# Patient Record
Sex: Male | Born: 1935 | Hispanic: Yes | Marital: Married | Smoking: Never smoker
Health system: Southern US, Community
[De-identification: ages and names within clinical notes are randomized; demographics above are authoritative.]

## PROBLEM LIST (undated history)

## (undated) DIAGNOSIS — E119 Type 2 diabetes mellitus without complications: Secondary | ICD-10-CM

## (undated) DIAGNOSIS — I10 Essential (primary) hypertension: Secondary | ICD-10-CM

## (undated) DIAGNOSIS — E785 Hyperlipidemia, unspecified: Secondary | ICD-10-CM

---

## 2015-07-09 ENCOUNTER — Emergency Department (HOSPITAL_COMMUNITY): Payer: Medicare (Managed Care)

## 2015-07-09 ENCOUNTER — Encounter (HOSPITAL_COMMUNITY): Payer: Self-pay | Admitting: *Deleted

## 2015-07-09 ENCOUNTER — Inpatient Hospital Stay (HOSPITAL_COMMUNITY)
Admission: EM | Admit: 2015-07-09 | Discharge: 2015-07-14 | DRG: 064 | Disposition: A | Discharge status: Skilled Nursing Facility | Payer: Medicare (Managed Care) | Attending: Internal Medicine | Admitting: Internal Medicine

## 2015-07-09 DIAGNOSIS — R4182 Altered mental status, unspecified: Secondary | ICD-10-CM | POA: Diagnosis not present

## 2015-07-09 DIAGNOSIS — Z833 Family history of diabetes mellitus: Secondary | ICD-10-CM

## 2015-07-09 DIAGNOSIS — I63522 Cerebral infarction due to unspecified occlusion or stenosis of left anterior cerebral artery: Secondary | ICD-10-CM

## 2015-07-09 DIAGNOSIS — E785 Hyperlipidemia, unspecified: Secondary | ICD-10-CM | POA: Diagnosis present

## 2015-07-09 DIAGNOSIS — I10 Essential (primary) hypertension: Secondary | ICD-10-CM | POA: Diagnosis present

## 2015-07-09 DIAGNOSIS — I69351 Hemiplegia and hemiparesis following cerebral infarction affecting right dominant side: Secondary | ICD-10-CM

## 2015-07-09 DIAGNOSIS — I639 Cerebral infarction, unspecified: Principal | ICD-10-CM

## 2015-07-09 DIAGNOSIS — J690 Pneumonitis due to inhalation of food and vomit: Secondary | ICD-10-CM

## 2015-07-09 DIAGNOSIS — B009 Herpesviral infection, unspecified: Secondary | ICD-10-CM | POA: Diagnosis present

## 2015-07-09 DIAGNOSIS — Z8249 Family history of ischemic heart disease and other diseases of the circulatory system: Secondary | ICD-10-CM

## 2015-07-09 DIAGNOSIS — R531 Weakness: Secondary | ICD-10-CM

## 2015-07-09 DIAGNOSIS — R339 Retention of urine, unspecified: Secondary | ICD-10-CM | POA: Diagnosis present

## 2015-07-09 DIAGNOSIS — F0391 Unspecified dementia with behavioral disturbance: Secondary | ICD-10-CM | POA: Insufficient documentation

## 2015-07-09 DIAGNOSIS — Z7189 Other specified counseling: Secondary | ICD-10-CM | POA: Insufficient documentation

## 2015-07-09 DIAGNOSIS — F03918 Unspecified dementia, unspecified severity, with other behavioral disturbance: Secondary | ICD-10-CM | POA: Insufficient documentation

## 2015-07-09 DIAGNOSIS — R1319 Other dysphagia: Secondary | ICD-10-CM | POA: Diagnosis present

## 2015-07-09 DIAGNOSIS — Z7984 Long term (current) use of oral hypoglycemic drugs: Secondary | ICD-10-CM

## 2015-07-09 DIAGNOSIS — Z515 Encounter for palliative care: Secondary | ICD-10-CM | POA: Insufficient documentation

## 2015-07-09 DIAGNOSIS — E119 Type 2 diabetes mellitus without complications: Secondary | ICD-10-CM | POA: Diagnosis present

## 2015-07-09 HISTORY — DX: Essential (primary) hypertension: I10

## 2015-07-09 HISTORY — DX: Type 2 diabetes mellitus without complications: E11.9

## 2015-07-09 HISTORY — DX: Hyperlipidemia, unspecified: E78.5

## 2015-07-09 LAB — URINALYSIS, ROUTINE W REFLEX MICROSCOPIC
Bilirubin Urine: NEGATIVE
Glucose, UA: NEGATIVE mg/dL
Ketones, ur: NEGATIVE mg/dL
Leukocytes, UA: NEGATIVE
Nitrite: NEGATIVE
PH: 5.5 (ref 5.0–8.0)
Protein, ur: NEGATIVE mg/dL
SPECIFIC GRAVITY, URINE: 1.023 (ref 1.005–1.030)
UROBILINOGEN UA: 1 mg/dL (ref 0.0–1.0)

## 2015-07-09 LAB — COMPREHENSIVE METABOLIC PANEL
ALBUMIN: 3.6 g/dL (ref 3.5–5.0)
ALK PHOS: 84 U/L (ref 38–126)
ALT: 23 U/L (ref 17–63)
ANION GAP: 13 (ref 5–15)
AST: 30 U/L (ref 15–41)
BILIRUBIN TOTAL: 1 mg/dL (ref 0.3–1.2)
BUN: 13 mg/dL (ref 6–20)
CALCIUM: 9.7 mg/dL (ref 8.9–10.3)
CO2: 23 mmol/L (ref 22–32)
Chloride: 96 mmol/L — ABNORMAL LOW (ref 101–111)
Creatinine, Ser: 0.94 mg/dL (ref 0.61–1.24)
GFR calc Af Amer: >60 mL/min (ref >60)
GFR calc non Af Amer: >60 mL/min (ref >60)
GLUCOSE: 144 mg/dL — AB (ref 65–99)
Potassium: 4.9 mmol/L (ref 3.5–5.1)
SODIUM: 132 mmol/L — AB (ref 135–145)
Total Protein: 7.4 g/dL (ref 6.5–8.1)

## 2015-07-09 LAB — PROTIME-INR
INR: 1.07 (ref 0.00–1.49)
PROTHROMBIN TIME: 14.1 s (ref 11.6–15.2)

## 2015-07-09 LAB — CBC WITH DIFFERENTIAL/PLATELET
BASOS ABS: 0 10*3/uL (ref 0.0–0.1)
BASOS PCT: 0 %
EOS ABS: 0.1 10*3/uL (ref 0.0–0.7)
Eosinophils Relative: 1 %
HEMATOCRIT: 45 % (ref 39.0–52.0)
HEMOGLOBIN: 15 g/dL (ref 13.0–17.0)
Lymphocytes Relative: 13 %
Lymphs Abs: 2 10*3/uL (ref 0.7–4.0)
MCH: 29.4 pg (ref 26.0–34.0)
MCHC: 33.3 g/dL (ref 30.0–36.0)
MCV: 88.2 fL (ref 78.0–100.0)
Monocytes Absolute: 0.9 10*3/uL (ref 0.1–1.0)
Monocytes Relative: 6 %
NEUTROS ABS: 12.7 10*3/uL — AB (ref 1.7–7.7)
NEUTROS PCT: 80 %
Platelets: 235 10*3/uL (ref 150–400)
RBC: 5.1 MIL/uL (ref 4.22–5.81)
RDW: 13.5 % (ref 11.5–15.5)
WBC: 15.6 10*3/uL — AB (ref 4.0–10.5)

## 2015-07-09 LAB — TROPONIN I: Troponin I: <0.03 ng/mL (ref <0.031)

## 2015-07-09 LAB — URINE MICROSCOPIC-ADD ON

## 2015-07-09 MED ORDER — LORAZEPAM 2 MG/ML IJ SOLN
1.0000 mg | Freq: Once | INTRAMUSCULAR | Status: AC
  Administered 2015-07-09: 1 mg via INTRAVENOUS
  Filled 2015-07-09: qty 1

## 2015-07-09 MED ORDER — SODIUM CHLORIDE 0.9 % IV BOLUS (SEPSIS)
1000.0000 mL | Freq: Once | INTRAVENOUS | Status: AC
  Administered 2015-07-09: 1000 mL via INTRAVENOUS

## 2015-07-09 NOTE — ED Notes (Signed)
Pt placed in a gown and hooked up to the monitor with a 5 lead, BP cuff and pulse ox 

## 2015-07-09 NOTE — ED Notes (Signed)
Pt brought by daughter stating that they just got off a plane from [place] and he is altered x 2 weeks.  Pt states she has limited knowledge of his condition stating he has recently in the last 2 weeks become more aggressive and unable to walk.  Pt has right sided weakness, non verbal, drooling from the left side of his mouth.  MD made aware and at bedside.  Pt is in advanced Alzheimer.

## 2015-07-09 NOTE — Consult Note (Signed)
Referring Physician: Dr Venora Maples, ED    Chief Complaint: altered mental status, right hemiparesis, stroke on MRI  HPI:                                                                                                                                         Willie Anderson is an 79 y.o. male with a past medical history significant for HTN, HLD, DM, and advanced dementia, brought in by family for evaluation of increased agitation and inability to move the right side. Patient has been living in [place] and reportedly has been increasingly agitated for the past few weeks, and at least 2 weeks ago became weak in the right side and stopped walking. Pt brought by daughter stating that they just got off a plane from [place]. MRI brain was personally reviewed and showed an acute infarct left frontal lobe. Presently, patient is awake, confused, answers yes/no questions.   Date last known well: unable to determine  Time last known well: unable to determine tPA Given: no, late presentation   Past Medical History  Diagnosis Date  . Hypertension   . Diabetes mellitus without complication ([place])   . Hyperlipemia     History reviewed. No pertinent past surgical history.  History reviewed. No pertinent family history. Social History:  reports that he has never smoked. He does not have any smokeless tobacco history on file. He reports that he drinks about 3.6 oz of alcohol per week. He reports that he does not use illicit drugs. Family history: no MS, epilepsy, brain tumor, or brain aneurysm Allergies: No Known Allergies  Medications:                                                                                                                           I have reviewed the patient's current medications.  ROS: unable to obtain due to mental status  History obtained  from family and chart review   Physical exam:  Constitutional: well developed, pleasant male in no apparent distress. Blood pressure 151/95, pulse 94, temperature 100.1 F (37.8 C), temperature source Rectal, resp. rate 22, height _0  (1.778 m), weight 81.647 kg (180 lb), SpO2 93 %. Eyes: no jaundice or exophthalmos.  Head: normocephalic. Neck: supple, no bruits, no JVD. Cardiac: no murmurs. Lungs: clear. Abdomen: soft, no tender, no mass. Extremities: no edema, clubbing, or cyanosis.  Skin: no rash  Neurologic Examination:                                                                                                      General: NAD Mental Status: Alert and awake, answers yes/no questions but does not follow comands. Cranial Nerves: II: Discs flat bilaterally; Visual fields grossly normal, pupils equal, round, reactive to light and accommodation III,IV, VI: ptosis not present, extra-ocular motions intact bilaterally V,VII: smile symmetric, facial light touch sensation ca not be reliably tested due to underlying dementia VIII: hearing normal bilaterally IX,X: uvula rises symmetrically XI: bilateral shoulder shrug no tested XII: midline tongue extension without atrophy or fasciculations Motor: Dense right hemiparesis. Tone decreased on the right Sensory: reacts to pain Deep Tendon Reflexes:  1+ all over Plantars: Right: mute   Left: mute Cerebellar: Unable to test due to mental status and weakness right side. Gait:  Unable to test due to mental status, multiple leads    Results for orders placed or performed during the hospital encounter of 07/09/15 (from the past 48 hour(s))  CBC with Differential/Platelet     Status: Abnormal   Collection Time: 07/09/15  9:32 PM  Result Value Ref Range   WBC 15.6 (H) 4.0 - 10.5 K/uL   RBC 5.10 4.22 - 5.81 MIL/uL   Hemoglobin 15.0 13.0 - 17.0 g/dL   HCT 45.0 39.0 - 52.0 %   MCV 88.2 78.0 - 100.0 fL   MCH 29.4 26.0 - 34.0 pg    MCHC 33.3 30.0 - 36.0 g/dL   RDW 13.5 11.5 - 15.5 %   Platelets 235 150 - 400 K/uL   Neutrophils Relative % 80 %   Neutro Abs 12.7 (H) 1.7 - 7.7 K/uL   Lymphocytes Relative 13 %   Lymphs Abs 2.0 0.7 - 4.0 K/uL   Monocytes Relative 6 %   Monocytes Absolute 0.9 0.1 - 1.0 K/uL   Eosinophils Relative 1 %   Eosinophils Absolute 0.1 0.0 - 0.7 K/uL   Basophils Relative 0 %   Basophils Absolute 0.0 0.0 - 0.1 K/uL  Comprehensive metabolic panel     Status: Abnormal   Collection Time: 07/09/15  9:32 PM  Result Value Ref Range   Sodium 132 (L) 135 - 145 mmol/L   Potassium 4.9 3.5 - 5.1 mmol/L   Chloride 96 (L) 101 - 111 mmol/L   CO2 23 22 - 32 mmol/L   Glucose, Bld 144 (H) 65 - 99 mg/dL   BUN 13 6 - 20 mg/dL   Creatinine, Ser 0.94 0.61 - 1.24 mg/dL  Calcium 9.7 8.9 - 10.3 mg/dL   Total Protein 7.4 6.5 - 8.1 g/dL   Albumin 3.6 3.5 - 5.0 g/dL   AST 30 15 - 41 U/L   ALT 23 17 - 63 U/L   Alkaline Phosphatase 84 38 - 126 U/L   Total Bilirubin 1.0 0.3 - 1.2 mg/dL   GFR calc non Af Amer >60 >60 mL/min   GFR calc Af Amer >60 >60 mL/min    Comment: (NOTE) The eGFR has been calculated using the CKD EPI equation. This calculation has not been validated in all clinical situations. eGFR's persistently <60 mL/min signify possible Chronic Kidney Disease.    Anion gap 13 5 - 15  Troponin I     Status: None   Collection Time: 07/09/15  9:32 PM  Result Value Ref Range   Troponin I <0.03 <0.031 ng/mL    Comment:        NO INDICATION OF MYOCARDIAL INJURY.   Protime-INR     Status: None   Collection Time: 07/09/15  9:32 PM  Result Value Ref Range   Prothrombin Time 14.1 11.6 - 15.2 seconds   INR 1.07 0.00 - 1.49  Urinalysis, Routine w reflex microscopic (not at [facility])     Status: Abnormal   Collection Time: 07/09/15 10:10 PM  Result Value Ref Range   Color, Urine AMBER (A) YELLOW    Comment: BIOCHEMICALS MAY BE AFFECTED BY COLOR   APPearance CLOUDY (A) CLEAR   Specific Gravity, Urine 1.023  1.005 - 1.030   pH 5.5 5.0 - 8.0   Glucose, UA NEGATIVE NEGATIVE mg/dL   Hgb urine dipstick LARGE (A) NEGATIVE   Bilirubin Urine NEGATIVE NEGATIVE   Ketones, ur NEGATIVE NEGATIVE mg/dL   Protein, ur NEGATIVE NEGATIVE mg/dL   Urobilinogen, UA 1.0 0.0 - 1.0 mg/dL   Nitrite NEGATIVE NEGATIVE   Leukocytes, UA NEGATIVE NEGATIVE  Urine microscopic-add on     Status: Abnormal   Collection Time: 07/09/15 10:10 PM  Result Value Ref Range   WBC, UA 0-2 <3 WBC/hpf   RBC / HPF 21-50 <3 RBC/hpf   Bacteria, UA FEW (A) RARE   Urine-Other MUCOUS PRESENT    Ct Head Wo Contrast  07/09/2015  CLINICAL DATA:  Right-sided weakness, altered mental status. Nonverbal. Alzheimer's. EXAM: CT HEAD WITHOUT CONTRAST TECHNIQUE: Contiguous axial images were obtained from the base of the skull through the vertex without intravenous contrast. COMPARISON:  None. FINDINGS: There is generalized brain atrophy with commensurate dilatation of the ventricles and sulci. Confluent areas of chronic small vessel ischemic change noted within the bilateral periventricular and subcortical white matter regions. Small old lacunar infarct within the right basal ganglia. There are chronic calcified atherosclerotic changes of the large vessels at the skull base. There is no mass, hemorrhage, edema, or other evidence of acute parenchymal abnormality. No extra-axial hemorrhage. No osseous abnormality. Visualized upper paranasal sinuses are clear. Mastoid air cells are clear. IMPRESSION: 1. No evidence of acute intracranial abnormality. No intracranial mass, hemorrhage, or edema. 2. Atrophy and chronic ischemic changes as detailed above. These results were called by telephone at the time of interpretation on 07/09/2015 at 9:27 pm to Dr. Jola Schmidt , who verbally acknowledged these results. Electronically Signed   By: Franki Cabot M.D.   On: 07/09/2015 21:27   Dg Chest Portable 1 View  07/09/2015  CLINICAL DATA:  Altered mental status for 2  weeks. Right-sided weakness. Initial encounter. EXAM: PORTABLE CHEST 1 VIEW COMPARISON:  None. FINDINGS:  The lungs are hypoexpanded. A right-sided calcified granuloma is noted. Minimal left basilar atelectasis is noted. There is no evidence of pleural effusion or pneumothorax. The cardiomediastinal silhouette is borderline normal in size. No acute osseous abnormalities are seen. IMPRESSION: Lungs hypoexpanded.  Minimal left basilar atelectasis noted. Electronically Signed   By: Garald Balding M.D.   On: 07/09/2015 21:21   Mr Brain Ltd W/o Cm  07/09/2015  CLINICAL DATA:  RIGHT-sided weakness after plane ride from [place]. Altered mental status for 2 weeks, aggressive, unable to walk. History of advanced at Alzheimer's disease. EXAM: MRI HEAD WITHOUT CONTRAST TECHNIQUE: Axial diffusion weighted imaging, sagittal T1 and axial T2 propeller. Patient was unable to tolerate further imaging due to agitation. COMPARISON:  CT head July 09, 2015 at 2114 hours. FINDINGS: Severely motion degraded axial T2 sequence, moderately motion degraded sagittal T1. 12 x 15 mm area of reduced diffusion LEFT posterior frontal lobe, with normalizing ADC values, and T2 bright signal. Moderate ventriculomegaly on the basis of global parenchymal brain volume loss better seen on prior CT. Patchy to confluent supratentorial white matter T2 hyperintensities. No midline shift. No abnormal extra-axial fluid collections. Major intracranial vascular flow voids present at the skullbase. No abnormal sellar expansion. No cerebellar tonsillar ectopia. No suspicious calvarial bone marrow signal. IMPRESSION: Limited motion degraded MRI, patient unable to complete the examination due to agitation. 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 At least moderate global parenchymal brain volume loss and moderate chronic small vessel ischemic disease. Electronically Signed   By: Elon Alas M.D.   On: 07/09/2015 23:38      Assessment: 79 y.o. male with HTN, HLD, DM, and advanced dementia, brought in by family for evaluation of increased agitation and right hemiparesis. Early subacute infarct on DWI-MRI. Patient is out of the window for iv tpa or endovascular intervention. Further, has advanced dementia and I am not sure he will benefit from pursuing comprehensive stroke work up. Stroke team will follow up tomorrow and make further recommendations. Admit to medicine. PT.    Stroke Risk Factors - age, HTN, HLD, DM  Plan: 1. HgbA1c, fasting lipid panel 2. MRI, MRA  of the brain without contrast 3. Echocardiogram 4. Carotid dopplers 5. Prophylactic therapy-ASA 6. Risk factor modification 7. Telemetry monitoring 8. Frequent neuro checks 9. PT/OT SLP 10. NPO    Dorian Pod, MD [facility] [phone]  07/09/2015, 11:54 PM

## 2015-07-09 NOTE — ED Notes (Signed)
Pt still off unit with MRI

## 2015-07-09 NOTE — ED Provider Notes (Signed)
CSN: [id number]     Arrival date & time 07/09/15  2032 History   First MD Initiated Contact with Patient 07/09/15 2034     Chief Complaint  Patient presents with  . Altered Mental Status     Level V caveat: Altered mental status  HPI History obtained from the patient's daughter  Patient lives in [place] and was brought to the [place] this evening by his daughter on an airplane as the daughter reports that he was having increasing confusion and agitation over the past several weeks while in [place] and over the past several days has had more limited mobility.  She states that her father has a history of dementia as well as diabetes, hypertension, hyperlipidemia.  She states that the physicians in [place] could not explain to her why he was having worsening symptoms over the past several weeks and that she flew to [place] and brought him back to the [place].  She reports that normally he can talk but the patient has not been communicating.  Daughters also reported drooling coming out of his mouth.  She reports that normally he needs some assistance to put his clothes on but he can ambulate.  She came immediately from the airport to the emergency department.  No reports of vomiting or diarrhea.  No reports of recent fever as far she knows.  Past Medical History  Diagnosis Date  . Hypertension   . Diabetes mellitus without complication ([facility])   . Hyperlipemia    History reviewed. No pertinent past surgical history. History reviewed. No pertinent family history. Social History  Substance Use Topics  . Smoking status: Never Smoker   . Smokeless tobacco: None  . Alcohol Use: 3.6 oz/week    6 Cans of beer per week     Comment: non past year    Review of Systems  Unable to perform ROS: Mental status change      Allergies  Review of patient's allergies indicates no known allergies.  Home Medications   Prior to Admission medications   Not on File   BP  137/89 mmHg  Pulse 99  Temp(Src) 100.1 F (37.8 C) (Rectal)  Resp 25  Ht 5\' 10"  (1.778 m)  Wt 180 lb (81.647 kg)  BMI 25.83 kg/m2  SpO2 95% Physical Exam  Constitutional: He appears well-developed and well-nourished.  HENT:  Head: Normocephalic and atraumatic.  Drooling of the left side of his mouth.  Eyes: EOM are normal.  Neck: Normal range of motion.  Cardiovascular: Normal rate, regular rhythm, normal heart sounds and intact distal pulses.   Pulmonary/Chest: Effort normal and breath sounds normal. No respiratory distress.  Abdominal: Soft. He exhibits no distension. There is no tenderness.  Musculoskeletal: Normal range of motion.  Neurological: He is alert.  Unable to follow simple commands.  Hemiparesis of his right upper and right lower extremity.  Able to hold his left hand up in his left leg up against gravity.  Left-sided facial droop.  Receptive aphasia  Skin: Skin is warm and dry.  Psychiatric: He has a normal mood and affect. Judgment normal.  Nursing note and vitals reviewed.   ED Course  Procedures (including critical care time) Labs Review Labs Reviewed  CBC WITH DIFFERENTIAL/PLATELET - Abnormal; Notable for the following:    WBC 15.6 (*)    Neutro Abs 12.7 (*)    All other components within normal limits  COMPREHENSIVE METABOLIC PANEL - Abnormal; Notable for the following:  Sodium 132 (*)    Chloride 96 (*)    Glucose, Bld 144 (*)    All other components within normal limits  URINALYSIS, ROUTINE W REFLEX MICROSCOPIC (NOT AT [facility]) - Abnormal; Notable for the following:    Color, Urine AMBER (*)    APPearance CLOUDY (*)    Hgb urine dipstick LARGE (*)    All other components within normal limits  URINE MICROSCOPIC-ADD ON - Abnormal; Notable for the following:    Bacteria, UA FEW (*)    All other components within normal limits  URINE CULTURE  CULTURE, BLOOD (ROUTINE X 2)  CULTURE, BLOOD (ROUTINE X 2)  TROPONIN I  PROTIME-INR  LACTIC ACID, PLASMA   ETHANOL    Imaging Review Ct Head Wo Contrast  07/09/2015  CLINICAL DATA:  Right-sided weakness, altered mental status. Nonverbal. Alzheimer's. EXAM: CT HEAD WITHOUT CONTRAST TECHNIQUE: Contiguous axial images were obtained from the base of the skull through the vertex without intravenous contrast. COMPARISON:  None. FINDINGS: There is generalized brain atrophy with commensurate dilatation of the ventricles and sulci. Confluent areas of chronic small vessel ischemic change noted within the bilateral periventricular and subcortical white matter regions. Small old lacunar infarct within the right basal ganglia. There are chronic calcified atherosclerotic changes of the large vessels at the skull base. There is no mass, hemorrhage, edema, or other evidence of acute parenchymal abnormality. No extra-axial hemorrhage. No osseous abnormality. Visualized upper paranasal sinuses are clear. Mastoid air cells are clear. IMPRESSION: 1. No evidence of acute intracranial abnormality. No intracranial mass, hemorrhage, or edema. 2. Atrophy and chronic ischemic changes as detailed above. These results were called by telephone at the time of interpretation on 07/09/2015 at 9:27 pm to Dr. Azalia Bilis , who verbally acknowledged these results. Electronically Signed   By: Bary Richard M.D.   On: 07/09/2015 21:27   Dg Chest Portable 1 View  07/09/2015  CLINICAL DATA:  Altered mental status for 2 weeks. Right-sided weakness. Initial encounter. EXAM: PORTABLE CHEST 1 VIEW COMPARISON:  None. FINDINGS: The lungs are hypoexpanded. A right-sided calcified granuloma is noted. Minimal left basilar atelectasis is noted. There is no evidence of pleural effusion or pneumothorax. The cardiomediastinal silhouette is borderline normal in size. No acute osseous abnormalities are seen. IMPRESSION: Lungs hypoexpanded.  Minimal left basilar atelectasis noted. Electronically Signed   By: Roanna Raider M.D.   On: 07/09/2015 21:21   I have  personally reviewed and evaluated these images and lab results as part of my medical decision-making.  ECG interpretation   Date: 07/09/2015  Rate: 95  Rhythm: normal sinus rhythm  QRS Axis: normal  Intervals: normal  ST/T Wave abnormalities: normal  Conduction Disutrbances: none  Narrative Interpretation:   Old EKG Reviewed: no prior ecg available     MDM   Final diagnoses:  Cerebrovascular accident (CVA), unspecified mechanism ([facility])    MRI with subacute left brain stroke which is consistent with his right-sided weakness.  Neurology consultation.  Admission to hospitalist.  Rectal temp of 100.1.  Remainder of labs without significant abnormality.    Azalia Bilis, MD 07/10/15 [phone]

## 2015-07-09 NOTE — ED Notes (Signed)
Pt returned to the room from MRI.

## 2015-07-10 ENCOUNTER — Inpatient Hospital Stay (HOSPITAL_COMMUNITY): Payer: Medicare (Managed Care)

## 2015-07-10 DIAGNOSIS — F0391 Unspecified dementia with behavioral disturbance: Secondary | ICD-10-CM | POA: Insufficient documentation

## 2015-07-10 DIAGNOSIS — I10 Essential (primary) hypertension: Secondary | ICD-10-CM | POA: Diagnosis present

## 2015-07-10 DIAGNOSIS — I639 Cerebral infarction, unspecified: Secondary | ICD-10-CM

## 2015-07-10 DIAGNOSIS — J690 Pneumonitis due to inhalation of food and vomit: Secondary | ICD-10-CM | POA: Diagnosis present

## 2015-07-10 DIAGNOSIS — F03918 Unspecified dementia, unspecified severity, with other behavioral disturbance: Secondary | ICD-10-CM | POA: Insufficient documentation

## 2015-07-10 DIAGNOSIS — Z833 Family history of diabetes mellitus: Secondary | ICD-10-CM | POA: Diagnosis not present

## 2015-07-10 DIAGNOSIS — I6789 Other cerebrovascular disease: Secondary | ICD-10-CM | POA: Diagnosis not present

## 2015-07-10 DIAGNOSIS — E785 Hyperlipidemia, unspecified: Secondary | ICD-10-CM | POA: Diagnosis present

## 2015-07-10 DIAGNOSIS — E119 Type 2 diabetes mellitus without complications: Secondary | ICD-10-CM | POA: Diagnosis present

## 2015-07-10 DIAGNOSIS — M6289 Other specified disorders of muscle: Secondary | ICD-10-CM

## 2015-07-10 DIAGNOSIS — I69351 Hemiplegia and hemiparesis following cerebral infarction affecting right dominant side: Secondary | ICD-10-CM | POA: Diagnosis not present

## 2015-07-10 DIAGNOSIS — Z8249 Family history of ischemic heart disease and other diseases of the circulatory system: Secondary | ICD-10-CM | POA: Diagnosis not present

## 2015-07-10 DIAGNOSIS — Z7984 Long term (current) use of oral hypoglycemic drugs: Secondary | ICD-10-CM | POA: Diagnosis not present

## 2015-07-10 DIAGNOSIS — R531 Weakness: Secondary | ICD-10-CM | POA: Diagnosis present

## 2015-07-10 DIAGNOSIS — Z7189 Other specified counseling: Secondary | ICD-10-CM | POA: Diagnosis not present

## 2015-07-10 DIAGNOSIS — Z515 Encounter for palliative care: Secondary | ICD-10-CM | POA: Diagnosis not present

## 2015-07-10 DIAGNOSIS — R1319 Other dysphagia: Secondary | ICD-10-CM | POA: Diagnosis present

## 2015-07-10 DIAGNOSIS — R339 Retention of urine, unspecified: Secondary | ICD-10-CM | POA: Diagnosis present

## 2015-07-10 DIAGNOSIS — I6300 Cerebral infarction due to thrombosis of unspecified precerebral artery: Secondary | ICD-10-CM | POA: Diagnosis not present

## 2015-07-10 DIAGNOSIS — B009 Herpesviral infection, unspecified: Secondary | ICD-10-CM | POA: Diagnosis present

## 2015-07-10 DIAGNOSIS — R4182 Altered mental status, unspecified: Secondary | ICD-10-CM | POA: Diagnosis present

## 2015-07-10 LAB — LIPID PANEL
CHOLESTEROL: 155 mg/dL (ref 0–200)
HDL: 27 mg/dL — ABNORMAL LOW (ref >40)
LDL Cholesterol: 94 mg/dL (ref 0–99)
TRIGLYCERIDES: 168 mg/dL — AB (ref <150)
Total CHOL/HDL Ratio: 5.7 RATIO
VLDL: 34 mg/dL (ref 0–40)

## 2015-07-10 LAB — COMPREHENSIVE METABOLIC PANEL
ALK PHOS: 82 U/L (ref 38–126)
ALT: 19 U/L (ref 17–63)
ANION GAP: 10 (ref 5–15)
AST: 25 U/L (ref 15–41)
Albumin: 3.2 g/dL — ABNORMAL LOW (ref 3.5–5.0)
BUN: 13 mg/dL (ref 6–20)
CALCIUM: 9.3 mg/dL (ref 8.9–10.3)
CO2: 24 mmol/L (ref 22–32)
CREATININE: 0.98 mg/dL (ref 0.61–1.24)
Chloride: 100 mmol/L — ABNORMAL LOW (ref 101–111)
Glucose, Bld: 137 mg/dL — ABNORMAL HIGH (ref 65–99)
Potassium: 4.2 mmol/L (ref 3.5–5.1)
Sodium: 134 mmol/L — ABNORMAL LOW (ref 135–145)
TOTAL PROTEIN: 6.6 g/dL (ref 6.5–8.1)
Total Bilirubin: 0.8 mg/dL (ref 0.3–1.2)

## 2015-07-10 LAB — GLUCOSE, CAPILLARY
GLUCOSE-CAPILLARY: 142 mg/dL — AB (ref 65–99)
GLUCOSE-CAPILLARY: 138 mg/dL — AB (ref 65–99)
GLUCOSE-CAPILLARY: 128 mg/dL — AB (ref 65–99)
GLUCOSE-CAPILLARY: 129 mg/dL — AB (ref 65–99)
Glucose-Capillary: 117 mg/dL — ABNORMAL HIGH (ref 65–99)

## 2015-07-10 LAB — MAGNESIUM: Magnesium: 2 mg/dL (ref 1.7–2.4)

## 2015-07-10 LAB — ETHANOL

## 2015-07-10 MED ORDER — ASPIRIN EC 81 MG PO TBEC: 81.0000 mg | DELAYED_RELEASE_TABLET | Freq: Every day | ORAL | Status: DC

## 2015-07-10 MED ORDER — STROKE: EARLY STAGES OF RECOVERY BOOK
Freq: Once | Status: AC
Start: 2015-07-10 — End: 2015-07-10
  Administered 2015-07-10: 01:00:00
  Filled 2015-07-10: qty 1

## 2015-07-10 MED ORDER — LORAZEPAM 2 MG/ML IJ SOLN: 0.5000 mg | Freq: Two times a day (BID) | INTRAMUSCULAR | Status: DC | PRN

## 2015-07-10 MED ORDER — ASPIRIN 300 MG RE SUPP
300.0000 mg | Freq: Every day | RECTAL | Status: DC
  Administered 2015-07-10: 300 mg via RECTAL
  Filled 2015-07-10 (×2): qty 1

## 2015-07-10 MED ORDER — SENNOSIDES-DOCUSATE SODIUM 8.6-50 MG PO TABS: 1.0000 | ORAL_TABLET | Freq: Every evening | ORAL | Status: DC | PRN

## 2015-07-10 MED ORDER — SODIUM CHLORIDE 0.9 % IV SOLN
INTRAVENOUS | Status: DC
  Administered 2015-07-10 – 2015-07-12 (×3): via INTRAVENOUS

## 2015-07-10 MED ORDER — INSULIN ASPART 100 UNIT/ML ~~LOC~~ SOLN
0.0000 [IU] | SUBCUTANEOUS | Status: DC
  Administered 2015-07-10: 1 [IU] via SUBCUTANEOUS
  Administered 2015-07-11: 2 [IU] via SUBCUTANEOUS

## 2015-07-10 MED ORDER — HEPARIN SODIUM (PORCINE) 5000 UNIT/ML IJ SOLN
5000.0000 [IU] | Freq: Three times a day (TID) | INTRAMUSCULAR | Status: DC
  Administered 2015-07-10 – 2015-07-14 (×11): 5000 [IU] via SUBCUTANEOUS
  Filled 2015-07-10 (×13): qty 1

## 2015-07-10 NOTE — Progress Notes (Signed)
Palliative Medicine Team consult was received. I met briefly with the patient's daughter this evening.  She is in agreement with meeting tomorrow to discuss goals of care moving forward.  If there are urgent needs or questions please call [phone].   Thank you for consulting out team to assist with this patients care.  Micheline Rough, MD [facility] [phone]

## 2015-07-10 NOTE — ED Notes (Signed)
Attempted report 

## 2015-07-10 NOTE — H&P (Signed)
[facility] History and Physical  Tuvia Woodrick BJY:[id number] DOB: Apr 27, 1936 DOA: 07/09/2015   PCP: No primary care provider on file.    Chief Complaint: Stroke  HPI: [place] Hellums is a 79 y.o. HM PMHx  HTN, HLD, Diabetes type 2  Patient lives in [place] and was brought to the [place] this evening by his daughter on an airplane as the daughter reports that he was having increasing confusion and agitation over the past several weeks while in [place] and over the past several days has had more limited mobility. She states that her father has a history of dementia as well as diabetes, hypertension, hyperlipidemia. She states that the physicians in [place] could not explain to her why he was having worsening symptoms over the past several weeks and that she flew to [place] and brought him back to the [place]. She reports that normally he can talk but the patient has not been communicating. Daughters also reported drooling coming out of his mouth. She reports that normally he needs some assistance to put his clothes on but he can ambulate. She came immediately from the airport to the emergency department. No reports of vomiting or diarrhea. No reports of recent fever as far she knows.  Patient A/O 0, obtunded, withdraws LUE/LLE/RLE to painful stimuli  Review of Systems: Unable to obtain     TRAVEL HISTORY: Just arrived from [place]   Consultants:  Arn Medal (neuro hospitalist)  Procedure/Significant Events:  10/29 CT head without contrast; Atrophy and chronic ischemic changes.-Negative CVA  10/29 MRI brain without contrast;Early subacute 12 x 15 mm LEFT posterior frontal lobe infarct -moderate global parenchymal brain volume loss/moderatechronic small vessel ischemic disease   Culture  NA   Antibiotics:   NA  DVT prophylaxis:  Heparin subcutaneous  Devices     LINES / TUBES:     Past Medical History   Diagnosis Date  . Hypertension   . Diabetes mellitus without complication (HCC)   . Hyperlipemia    History reviewed. No pertinent past surgical history. Social History:  reports that he has never smoked. He does not have any smokeless tobacco history on file. He reports that he drinks about 3.6 oz of alcohol per week. He reports that he does not use illicit drugs. where does patient live--home, ALF, SNF? Unable to obtain secondary to patient being obtunded Can patient participate in ADLs? No  No Known Allergies  FMHx .Unable to obtain secondary to patient being attended   .    Prior to Admission medications   Medication Sig Start Date End Date Taking? Authorizing Provider  clonazePAM (KLONOPIN) 2 MG tablet Take 2 mg by mouth 2 (two) times daily.   Yes Historical Provider, MD  furosemide (LASIX) 20 MG tablet Take 20 mg by mouth daily.   Yes Historical Provider, MD  lisinopril (PRINIVIL,ZESTRIL) 10 MG tablet Take 10 mg by mouth daily.   Yes Historical Provider, MD  metFORMIN (GLUCOPHAGE) 500 MG tablet Take 500 mg by mouth daily with breakfast.   Yes Historical Provider, MD  simvastatin (ZOCOR) 10 MG tablet Take 10 mg by mouth daily.   Yes Historical Provider, MD  temazepam (RESTORIL) 15 MG capsule Take 15 mg by mouth at bedtime.   Yes Historical Provider, MD   Physical Exam: Filed Vitals:   07/09/15 2215 07/10/15 0000 07/10/15 0044 07/10/15 0133  BP: 151/95 140/75 145/76 127/75  Pulse: 94   93  Temp:    98.8  F (37.1 C)  TempSrc:    Axillary  Resp: 22   18  Height:      Weight:      SpO2: 93%   95%     General: A/O 0, obtunded, No acute respiratory distress Eyes: Pinpoint pupils, unreactive to light, negative scleral hemorrhage ENT: Negative Runny negative gingival bleeding Neck:  Negative scars, masses, torticollis, lymphadenopathy, JVD Lungs: Clear to auscultation bilaterally without wheezes or crackles Cardiovascular: Regular rate and rhythm without murmur gallop or  rub normal S1 and S2 Abdomen:negative abdominal pain, negative dysphagia, Nontender, nondistended, soft, bowel sounds positive, no rebound, no ascites, no appreciable mass Extremities: No significant cyanosis, clubbing, or edema bilateral lower extremities Psychiatric:  Unable to obtain secondary to patient being obtunded Neurologic: Unable to obtain secondary to patient being obtunded Skin/Hemolytic/lymphatic; negative purpura, petechia,     Labs on Admission:  Basic Metabolic Panel:  Recent Labs Lab 07/09/15 2132  NA 132*  K 4.9  CL 96*  CO2 23  GLUCOSE 144*  BUN 13  CREATININE 0.94  CALCIUM 9.7   Liver Function Tests:  Recent Labs Lab 07/09/15 2132  AST 30  ALT 23  ALKPHOS 84  BILITOT 1.0  PROT 7.4  ALBUMIN 3.6   No results for input(s): LIPASE, AMYLASE in the last 168 hours. No results for input(s): AMMONIA in the last 168 hours. CBC:  Recent Labs Lab 07/09/15 2132  WBC 15.6*  NEUTROABS 12.7*  HGB 15.0  HCT 45.0  MCV 88.2  PLT 235   Cardiac Enzymes:  Recent Labs Lab 07/09/15 2132  TROPONINI <0.03    BNP (last 3 results) No results for input(s): BNP in the last 8760 hours.  ProBNP (last 3 results) No results for input(s): PROBNP in the last 8760 hours.  CBG: No results for input(s): GLUCAP in the last 168 hours.  Radiological Exams on Admission: Ct Head Wo Contrast  07/09/2015  CLINICAL DATA:  Right-sided weakness, altered mental status. Nonverbal. Alzheimer's. EXAM: CT HEAD WITHOUT CONTRAST TECHNIQUE: Contiguous axial images were obtained from the base of the skull through the vertex without intravenous contrast. COMPARISON:  None. FINDINGS: There is generalized brain atrophy with commensurate dilatation of the ventricles and sulci. Confluent areas of chronic small vessel ischemic change noted within the bilateral periventricular and subcortical white matter regions. Small old lacunar infarct within the right basal ganglia. There are chronic  calcified atherosclerotic changes of the large vessels at the skull base. There is no mass, hemorrhage, edema, or other evidence of acute parenchymal abnormality. No extra-axial hemorrhage. No osseous abnormality. Visualized upper paranasal sinuses are clear. Mastoid air cells are clear. IMPRESSION: 1. No evidence of acute intracranial abnormality. No intracranial mass, hemorrhage, or edema. 2. Atrophy and chronic ischemic changes as detailed above. These results were called by telephone at the time of interpretation on 07/09/2015 at 9:27 pm to Dr. Azalia Bilis , who verbally acknowledged these results. Electronically Signed   By: Bary Richard M.D.   On: 07/09/2015 21:27   Dg Chest Portable 1 View  07/09/2015  CLINICAL DATA:  Altered mental status for 2 weeks. Right-sided weakness. Initial encounter. EXAM: PORTABLE CHEST 1 VIEW COMPARISON:  None. FINDINGS: The lungs are hypoexpanded. A right-sided calcified granuloma is noted. Minimal left basilar atelectasis is noted. There is no evidence of pleural effusion or pneumothorax. The cardiomediastinal silhouette is borderline normal in size. No acute osseous abnormalities are seen. IMPRESSION: Lungs hypoexpanded.  Minimal left basilar atelectasis noted. Electronically Signed   By:  Roanna RaiderJeffery  Chang M.D.   On: 07/09/2015 21:21   Mr Brain Ltd W/o Cm  07/09/2015  CLINICAL DATA:  RIGHT-sided weakness after plane ride from [place]. Altered mental status for 2 weeks, aggressive, unable to walk. History of advanced at Alzheimer's disease. EXAM: MRI HEAD WITHOUT CONTRAST TECHNIQUE: Axial diffusion weighted imaging, sagittal T1 and axial T2 propeller. Patient was unable to tolerate further imaging due to agitation. COMPARISON:  CT head July 09, 2015 at 2114 hours. FINDINGS: Severely motion degraded axial T2 sequence, moderately motion degraded sagittal T1. 12 x 15 mm area of reduced diffusion LEFT posterior frontal lobe, with normalizing ADC values, and T2 bright  signal. Moderate ventriculomegaly on the basis of global parenchymal brain volume loss better seen on prior CT. Patchy to confluent supratentorial white matter T2 hyperintensities. No midline shift. No abnormal extra-axial fluid collections. Major intracranial vascular flow voids present at the skullbase. No abnormal sellar expansion. No cerebellar tonsillar ectopia. No suspicious calvarial bone marrow signal. IMPRESSION: Limited motion degraded MRI, patient unable to complete the examination due to agitation. 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 At least moderate global parenchymal brain volume loss and moderate chronic small vessel ischemic disease. Electronically Signed   By: Awilda Metroourtnay  Bloomer M.D.   On: 07/09/2015 23:38    EKG:   Assessment/Plan Active Problems:   Stroke (cerebrum) (HCC)   Aspiration pneumonia (HCC)   Cerebrovascular accident (CVA) (HCC)   Right sided weakness   Essential hypertension   HLD (hyperlipidemia)  Stroke -Patient with subacute stroke. -Obtain echocardiogram -Obtain carotid Doppler -If patient's cognition does not improve in next 24-36 hours will repeat brain MRI R/O extension of stroke.  HTN -Allow permissive HTN SBP goal 145-165 -Normal saline 4975ml/hr  HLD,  -Lipid panel pending -When patient able to take by mouth will start Lipitor  Diabetes type 2 -Sensitive SSI  -Hemoglobin A1c pending   Aspiration pneumonia? -Will hold on starting antibiotics, however if WBCs do not trend down or patient spikes fever, or CXR positive for development of pneumonia will begin appropriate antibiotic    Code Status: Full Family Communication: None  Disposition Plan: CIR vs SNF   Care during the described time interval was provided by me .  I have reviewed this patient's available data, including medical history, events of note, physical examination, and all test results as part of my evaluation. I have personally reviewed and  interpreted all radiology studies.    Time spent: 60 minute  WOODS, Roselind MessierCURTIS J [facility] Pager [phone]  If 7PM-7AM, please contact night-coverage [email or web address] Password [email or web address] 07/10/2015, 3:07 AM

## 2015-07-10 NOTE — Progress Notes (Signed)
VASCULAR LAB PRELIMINARY  PRELIMINARY  PRELIMINARY  PRELIMINARY  Carotid duplex completed.    Preliminary report:  Bilateral:  1-39% ICA stenosis.  Vertebral artery flow is antegrade.     Man Bonneau, RVS 07/10/2015, 8:25 AM

## 2015-07-10 NOTE — Progress Notes (Signed)
PT Cancellation Note  Patient Details Name: Willie Anderson MRN: [id number] DOB: 03/02/1936   Cancelled Treatment:    Reason Eval/Treat Not Completed: Medical issues which prohibited therapy (bedrest).  Will check tomorrow to see if precautions lifted.   Ivar DrapeStout, Areanna Gengler E 07/10/2015, 11:35 AM   Samul Dadauth Alira Fretwell, PT MS Acute Rehab Dept. Number: [facility] [phone] and [facility] [phone]

## 2015-07-10 NOTE — Progress Notes (Addendum)
PATIENT DETAILS Name: Willie Anderson Age: 79 y.o. Sex: male Date of Birth: [birth year] Admit Date: 07/09/2015 Admitting Physician Drema Dallas, MD PCP:No primary care provider on file.  Brief narrative:   79 year old [place] male with history of advanced dementia, diabetes, hypertension, dyslipidemia-brought to [facility] [place] for evaluation of worsening mental status and right-sided hemiparesis. MRI brain positive for an acute left frontal lobe infarct. Stroke workup in progress, remains nothing by mouth, very poor overall prognoses-after discussion with family-palliative care consulted.   Subjective: Coughing-seems to be poolling some secretions at times. Even with a nurse tech was speaks Spanish-not following commands.   Assessment/Plan: Acute CVA: With resultant significant dysphagia, dysarthria and right-sided hemiparesis. MRI brain positive for acute left frontal lobe infarct. Carotid Doppler negative for significant stenosis. LDL 94 (goal<70). Echocardiogram and A1c pending.Since nothing by mouth and failed swallow evaluation-change aspirin to suppository, await further input from neurology-but given advanced dementia-and significant deficits due to acute CVA-suspect further aggressive workup will not improve quality of life. Spoke with patient's daughter Kasandra Knudsen over the phone-explained poor long-term prognoses-explained issues with dysphagia, mobility, disposition. She will discuss with family, however we will go ahead and obtain a palliative care consultation.  Dysphagia: secondary to above-suspect given advanced dementia could have had underlying mild dysphagia to begin with. Keep nothing by mouth, await SLP follow-up evaluation-family aware of significant risk of aspiration of secretions. Consulted palliative care  Leukocytosis: Afebrile, UA negative for UTI, chest x-ray negative pneumonia-is at risk for aspiration. Blood cultures  negative so far. Continue to monitor off antibiotics-repeat CBC in a.m.  Hypertension: Allow permissive hypertension-hold lisinopril.  Dyslipidemia: See above regarding LDL-unable to start statins is nothing by mouth.  Advanced dementia: Supportive care for now-unfortunately now with acute CVA and with poor long-term prognosis   Goals of care: Remains full code. Briefly spoke with patient's daughter Kasandra Knudsen over the phone-explained for long-term prognoses-explained issues regarding dysphagia/mobility and disposition. Await further goals of care discussion with palliative care team  Disposition: Remain inpatient  Antimicrobial agents  See below  Anti-infectives    None      DVT Prophylaxis: Prophylactic Heparin  Code Status: Full code   Family Communication D[place]ter-Monday at [place] over the phone   Procedures: None  CONSULTS:  neurology  Time spent 30 minutes-Greater than 50% of this time was spent in counseling, explanation of diagnosis, planning of further management, and coordination of care.  MEDICATIONS: Scheduled Meds: . aspirin EC  81 mg Oral Daily  . heparin  5,000 Units Subcutaneous 3 times per day  . insulin aspart  0-9 Units Subcutaneous 6 times per day   Continuous Infusions: . sodium chloride 75 mL/hr at 07/10/15 1000   PRN Meds:.senna-docusate    PHYSICAL EXAM: Vital signs in last 24 hours: Filed Vitals:   07/10/15 0327 07/10/15 0542 07/10/15 0727 07/10/15 0952  BP: 137/85 118/78 133/81 103/91  Pulse:  94 72 73  Temp: 98.8 F (37.1 C) 99.3 F (37.4 C) 98.2 F (36.8 C) 98.6 F (37 C)  TempSrc: Oral Oral Axillary Axillary  Resp: Height:      Weight:      SpO2: 95% 96% 95% 96%    Weight change:  Filed Weights   07/09/15 2046  Weight: 81.647 kg (180 lb)   Body mass index is 25.83 kg/(m^2).   Gen Exam: Awake-mumbles incoherently-dysarthric  speech. Does not follow commands. Not in any distress. Neck:  Suppl Chest: B/L Clear.   CVS: S1 S2 Regular, no murmurs.  Abdomen: soft, BS +, non tender, non distended.  Extremities: no edema, lower extremities warm to touch. Neurologic: right hemiparesis-unable to assess further as patient not following commands-right upper extremity appears to be significantly weaker than the right lower extremity. Right facial droop present. Skin: No Rash.   Wounds: N/A.    Intake/Output from previous day:  Intake/Output Summary (Last 24 hours) at 07/10/15 1108 Last data filed at 07/10/15 1000  Gross per 24 hour  Intake 488.75 ml  Output     50 ml  Net 438.75 ml     LAB RESULTS: CBC  Recent Labs Lab 07/09/15 2132  WBC 15.6*  HGB 15.0  HCT 45.0  PLT 235  MCV 88.2  MCH 29.4  MCHC 33.3  RDW 13.5  LYMPHSABS 2.0  MONOABS 0.9  EOSABS 0.1  BASOSABS 0.0    Chemistries   Recent Labs Lab 07/09/15 2132 07/10/15 0607  NA 132* 134*  K 4.9 4.2  CL 96* 100*  CO2 23 24  GLUCOSE 144* 137*  BUN 13 13  CREATININE 0.94 0.98  CALCIUM 9.7 9.3  MG  --  2.0    CBG:  Recent Labs Lab 07/10/15 0326 07/10/15 0731  GLUCAP 142* 138*    GFR Estimated Creatinine Clearance: 65.2 mL/min (by C-G formula based on Cr of 0.98).  Coagulation profile  Recent Labs Lab 07/09/15 2132  INR 1.07    Cardiac Enzymes  Recent Labs Lab 07/09/15 2132  TROPONINI <0.03    Invalid input(s): POCBNP No results for input(s): DDIMER in the last 72 hours. No results for input(s): HGBA1C in the last 72 hours.  Recent Labs  07/10/15 0607  CHOL 155  HDL 27*  LDLCALC 94  TRIG 161*  CHOLHDL 5.7   No results for input(s): TSH, T4TOTAL, T3FREE, THYROIDAB in the last 72 hours.  Invalid input(s): FREET3 No results for input(s): VITAMINB12, FOLATE, FERRITIN, TIBC, IRON, RETICCTPCT in the last 72 hours. No results for input(s): LIPASE, AMYLASE in the last 72 hours.  Urine Studies No results for input(s): UHGB, CRYS in the last 72 hours.  Invalid  input(s): UACOL, UAPR, USPG, UPH, UTP, UGL, UKET, UBIL, UNIT, UROB, ULEU, UEPI, UWBC, URBC, UBAC, CAST, UCOM, BILUA  MICROBIOLOGY: Recent Results (from the past 240 hour(s))  Blood culture (routine x 2)     Status: None (Preliminary result)   Collection Time: 07/09/15  9:24 PM  Result Value Ref Range Status   Specimen Description BLOOD RIGHT ARM  Final   Special Requests IN PEDIATRIC BOTTLE 2CC  Final   Culture NO GROWTH < 12 HOURS  Final   Report Status PENDING  Incomplete  Blood culture (routine x 2)     Status: None (Preliminary result)   Collection Time: 07/09/15  9:29 PM  Result Value Ref Range Status   Specimen Description BLOOD RIGHT FOREARM  Final   Special Requests IN PEDIATRIC BOTTLE 2CC  Final   Culture NO GROWTH < 12 HOURS  Final   Report Status PENDING  Incomplete    RADIOLOGY STUDIES/RESULTS: Ct Head Wo Contrast  07/09/2015  CLINICAL DATA:  Right-sided weakness, altered mental status. Nonverbal. Alzheimer's. EXAM: CT HEAD WITHOUT CONTRAST TECHNIQUE: Contiguous axial images were obtained from the base of the skull through the vertex without intravenous contrast. COMPARISON:  None. FINDINGS: There is generalized brain atrophy with commensurate dilatation of  the ventricles and sulci. Confluent areas of chronic small vessel ischemic change noted within the bilateral periventricular and subcortical white matter regions. Small old lacunar infarct within the right basal ganglia. There are chronic calcified atherosclerotic changes of the large vessels at the skull base. There is no mass, hemorrhage, edema, or other evidence of acute parenchymal abnormality. No extra-axial hemorrhage. No osseous abnormality. Visualized upper paranasal sinuses are clear. Mastoid air cells are clear. IMPRESSION: 1. No evidence of acute intracranial abnormality. No intracranial mass, hemorrhage, or edema. 2. Atrophy and chronic ischemic changes as detailed above. These results were called by telephone at the  time of interpretation on 07/09/2015 at 9:27 pm to Dr. Azalia BilisKEVIN CAMPOS , who verbally acknowledged these results. Electronically Signed   By: Bary RichardStan  Maynard M.D.   On: 07/09/2015 21:27   Dg Chest Port 1 View  07/10/2015  CLINICAL DATA:  Aspiration pneumonia, confusion EXAM: PORTABLE CHEST 1 VIEW COMPARISON:  07/09/2015 FINDINGS: Low lung volumes. Calcified granuloma in the right upper lobe. No focal consolidation. No pleural effusion or pneumothorax. The heart is normal in size. IMPRESSION: No evidence of acute cardiopulmonary disease. Electronically Signed   By: Charline BillsSriyesh  Krishnan M.D.   On: 07/10/2015 09:44   Dg Chest Portable 1 View  07/09/2015  CLINICAL DATA:  Altered mental status for 2 weeks. Right-sided weakness. Initial encounter. EXAM: PORTABLE CHEST 1 VIEW COMPARISON:  None. FINDINGS: The lungs are hypoexpanded. A right-sided calcified granuloma is noted. Minimal left basilar atelectasis is noted. There is no evidence of pleural effusion or pneumothorax. The cardiomediastinal silhouette is borderline normal in size. No acute osseous abnormalities are seen. IMPRESSION: Lungs hypoexpanded.  Minimal left basilar atelectasis noted. Electronically Signed   By: Roanna RaiderJeffery  Chang M.D.   On: 07/09/2015 21:21   Mr Brain Ltd W/o Cm  07/09/2015  CLINICAL DATA:  RIGHT-sided weakness after plane ride from [place]. Altered mental status for 2 weeks, aggressive, unable to walk. History of advanced at Alzheimer's disease. EXAM: MRI HEAD WITHOUT CONTRAST TECHNIQUE: Axial diffusion weighted imaging, sagittal T1 and axial T2 propeller. Patient was unable to tolerate further imaging due to agitation. COMPARISON:  CT head July 09, 2015 at 2114 hours. FINDINGS: Severely motion degraded axial T2 sequence, moderately motion degraded sagittal T1. 12 x 15 mm area of reduced diffusion LEFT posterior frontal lobe, with normalizing ADC values, and T2 bright signal. Moderate ventriculomegaly on the basis of global  parenchymal brain volume loss better seen on prior CT. Patchy to confluent supratentorial white matter T2 hyperintensities. No midline shift. No abnormal extra-axial fluid collections. Major intracranial vascular flow voids present at the skullbase. No abnormal sellar expansion. No cerebellar tonsillar ectopia. No suspicious calvarial bone marrow signal. IMPRESSION: Limited motion degraded MRI, patient unable to complete the examination due to agitation. 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 At least moderate global parenchymal brain volume loss and moderate chronic small vessel ischemic disease. Electronically Signed   By: Awilda Metroourtnay  Bloomer M.D.   On: 07/09/2015 23:38    Jeoffrey MassedGHIMIRE,Alf Doyle, MD  [facility] Pager:[phone]  If 7PM-7AM, please contact night-coverage [email or web address] Password [email or web address] 07/10/2015, 11:08 AM   LOS: 0 days

## 2015-07-10 NOTE — Progress Notes (Signed)
STROKE TEAM PROGRESS NOTE   HISTORY Willie Anderson is a 79 y.o. male with a past medical history significant for HTN, HLD, DM, and advanced dementia, brought in by family for evaluation of increased agitation and inability to move the right side. Patient has been living in [place] and reportedly has been increasingly agitated for the past few weeks, and at least 2 weeks ago became weak in the right side and stopped walking. Pt brought by daughter stating that they just got off a plane from [place] MRI brain was personally reviewed and showed an acute infarct left frontal lobe. Presently, patient is awake, confused, answers yes/no questions.   Date last known well: unable to determine  Time last known well: unable to determine tPA Given: no, late presentation   SUBJECTIVE (INTERVAL HISTORY) His family is not at the bedside is at the bedside.  Overall, his dementia does not allow him to express how he feels his condition or perform a ROS.   OBJECTIVE Temp:  [98 F (36.7 C)-100.1 F (37.8 C)] 98 F (36.7 C) (10/30 1312) Pulse Rate:  [72-100] 76 (10/30 1312) Cardiac Rhythm:  [-] Normal sinus rhythm (10/30 1350) Resp:  [17-25] 18 (10/30 1312) BP: (94-151)/(64-95) 94/73 mmHg (10/30 1312) SpO2:  [93 %-97 %] 97 % (10/30 1312) Weight:  [81.647 kg (180 lb)] 81.647 kg (180 lb) (10/29 2046)  CBC:  Recent Labs Lab 07/09/15 2132  WBC 15.6*  NEUTROABS 12.7*  HGB 15.0  HCT 45.0  MCV 88.2  PLT 235    Basic Metabolic Panel:  Recent Labs Lab 07/09/15 2132 07/10/15 0607  NA 132* 134*  K 4.9 4.2  CL 96* 100*  CO2 23 24  GLUCOSE 144* 137*  BUN 13 13  CREATININE 0.94 0.98  CALCIUM 9.7 9.3  MG  --  2.0    Lipid Panel:    Component Value Date/Time   CHOL 155 07/10/2015 0607   TRIG 168* 07/10/2015 0607   HDL 27* 07/10/2015 0607   CHOLHDL 5.7 07/10/2015 0607   VLDL 34 07/10/2015 0607   LDLCALC 94 07/10/2015 0607   HgbA1c: No results found for: HGBA1C Urine Drug  Screen: No results found for: LABOPIA, COCAINSCRNUR, LABBENZ, AMPHETMU, THCU, LABBARB    IMAGING  Ct Head Wo Contrast 07/09/2015   1. No evidence of acute intracranial abnormality. No intracranial mass, hemorrhage, or edema.  2. Atrophy and chronic ischemic changes as detailed above.   Dg Chest Port 1 View 07/10/2015   No evidence of acute cardiopulmonary disease.   Dg Chest Portable 1 View 07/09/2015   Lungs hypoexpanded.  Minimal left basilar atelectasis noted.   Mr Brain Ltd W/o Cm 07/09/2015   Limited motion degraded MRI, patient unable to complete the examination due to agitation. 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 At least moderate global parenchymal brain volume loss and moderate chronic small vessel ischemic disease.   PHYSICAL EXAM Physical Exam General - Well nourished, well developed, in NAD; very confused and mumbling; neck appears supple on exam Cardiovascular - Regular rate and rhythm Pulmonary: CTA Abdomen: NT, ND, normal bowel sounds Extremities: No C/C/E  Neurological Exam Mental Status: Confused, mumbling.  Does say his name but does not follow commands Orientation:  Unable to assess Speech:  Unable to assess.  Fluent; no dysarthria Cranial Nerves:  PERRL; EOMI; visual fields ful, to threat face grossly symmetric, hearing grossly intact; remainder of exam limited by mental status Motor Exam:  Tone:  [place] Motor/Sensory: withdraws to noxious stimuli x4 extremities Coordination:  Intact finger to nose Gait: Deferred   ASSESSMENT/PLAN [name] is a 79 y.o. male with history of hypertension, diabetes mellitus, and hyperlipidemia, and advanced dementia presenting with increased agitation and right hemiparesis.  He did not receive IV t-PA due to late presentation.   Stroke: Possible Dominant infarct secondary to small vessel disease.  However, the diffusion and ADC mismatch may suggest different underlying  pathology.  Ordered MRI of head with contrast  Resultant  right hemiparesis  MRI  Possible Early subacute 12 x 15 mm LEFT posterior frontal lobe infarct.  MRA  not performed  Carotid Doppler - Bilateral: 1-39% ICA stenosis. Vertebral artery flow is antegrade.   2D Echo - pending  LDL - 94  HgbA1c pending  VTE prophylaxis - subcutaneous heparin  Diet NPO time specified  No antithrombotic prior to admission, now on aspirin 300 mg suppository daily  Patient counseled to be compliant with his antithrombotic medications  Ongoing aggressive stroke risk factor management  Therapy recommendations: Pending  Disposition:  Pending  Hypertension  Blood pressure runs low - not on antihypertensive medications at this time. Permissive hypertension (OK if < 220/120) but gradually normalize in 5-7 days  Hyperlipidemia  Home meds:  Simvastatin 10 mg daily not resumed in hospital - NPO  LDL 94, goal < 70  Increase simvastatin to 20 mg daily when able to swallow medications.  Continue statin at discharge  Diabetes  HgbA1c pending, goal < 7.0  Controlled  Other Stroke Risk Factors  Advanced age  ETOH use  Other Active Problems  Dementia  Na - 134 slight hyponatremia  Leukocytosis and low grade fever; undergoing evaluation by Primary team; Given recent travel, if MRI with contrast does not clearly indicate that findings are ischemic versus infectious, may need to broaden differential.  If contrasted study confirms a non-infectious etiology, conclude with stroke work-up  Hospital day # 0     To contact Stroke Continuity provider, please refer to [email or web address]. After hours, contact General Neurology

## 2015-07-10 NOTE — Evaluation (Signed)
Clinical/Bedside Swallow Evaluation Patient Details  Name: Willie Anderson MRN: [id number] Date of Birth: Apr 12, 1936  Today's Date: 07/10/2015 Time: SLP Start Time (ACUTE ONLY): 1010 SLP Stop Time (ACUTE ONLY): 1033 SLP Time Calculation (min) (ACUTE ONLY): 23 min  Past Medical History:  Past Medical History  Diagnosis Date  . Hypertension   . Diabetes mellitus without complication ([place])   . Hyperlipemia    Past Surgical History: History reviewed. No pertinent past surgical history. HPI:  Willie Anderson is an 79 y.o. male with a past medical history significant for HTN, HLD, DM, and advanced dementia, brought in by family for evaluation of increased agitation and inability to move the right side. Patient has been living in [place] and reportedly has been increasingly agitated for the past few weeks, and at least 2 weeks ago became weak in the right side and stopped walking. Pt brought by daughter stating that they just got off a plane from [place]. MRI brain showed an acute infarct left frontal lobe.   Assessment / Plan / Recommendation Clinical Impression  Pt difficult to arouse for assessment - with tactile/verbal cues, pt opened eyes and accepted limited ice chips from examiner.  All trials resulted in immediate, explosive coughing, concerning for aspiration.  Further attempts at assessment were met with resistance from pt, using LUE to push away utensils/cup.  He looked at examiner, but did not verbalize nor follow commands, despite assist from NT who speaks Spanish.  Recommend continued NPO - SLP will f/u next date to determine PO readiness.     Aspiration Risk  Moderate    Diet Recommendation NPO        Other  Recommendations Oral Care Recommendations: Oral care QID   Follow Up Recommendations       Frequency and Duration min 3x week  2 weeks       SLP Swallow Goals     Swallow Study Prior Functional Status       General Other Pertinent Information:  Willie Anderson is an 79 y.o. male with a past medical history significant for HTN, HLD, DM, and advanced dementia, brought in by family for evaluation of increased agitation and inability to move the right side. Patient has been living in [place] and reportedly has been increasingly agitated for the past few weeks, and at least 2 weeks ago became weak in the right side and stopped walking. Pt brought by daughter stating that they just got off a plane from [place]. MRI brain showed an acute infarct left frontal lobe. Type of Study: Bedside swallow evaluation Previous Swallow Assessment: no records Diet Prior to this Study: NPO Temperature Spikes Noted: No Respiratory Status: Room air History of Recent Intubation: No Behavior/Cognition: Alert (minimally) Self-Feeding Abilities: Total assist Patient Positioning: Upright in bed Baseline Vocal Quality: Not observed Volitional Cough: Cognitively unable to elicit Volitional Swallow: Unable to elicit    Oral/Motor/Sensory Function Overall Oral Motor/Sensory Function: Appears within functional limits for tasks assessed   Ice Chips Ice chips: Impaired Presentation: Spoon Oral Phase Impairments: Poor awareness of bolus Oral Phase Functional Implications: Prolonged oral transit Pharyngeal Phase Impairments: Suspected delayed Swallow;Cough - Immediate   Thin Liquid Thin Liquid:  (Pt pushed spoon away )    Nectar Thick Nectar Thick Liquid: Not tested   Honey Thick Honey Thick Liquid: Not tested   Puree Puree: Not tested   Solid  Willie Anderson, Willie Anderson Pager [phone]     Solid: Not tested  Willie Anderson 07/10/2015,10:38 AM

## 2015-07-10 NOTE — Progress Notes (Signed)
Pt arrived to unit without incident. Call bell at side. Bed alarm activated Tele placed and VS obtained. Will continue to monitor. Gara KronerHayles, Todd Argabright M, RN

## 2015-07-10 NOTE — ED Notes (Signed)
Nani SkillernHelen P Rodriguez [phone] daughter will return tomorrow .

## 2015-07-11 ENCOUNTER — Inpatient Hospital Stay (HOSPITAL_COMMUNITY): Payer: Medicare (Managed Care)

## 2015-07-11 DIAGNOSIS — I6789 Other cerebrovascular disease: Secondary | ICD-10-CM

## 2015-07-11 DIAGNOSIS — Z7189 Other specified counseling: Secondary | ICD-10-CM

## 2015-07-11 DIAGNOSIS — I639 Cerebral infarction, unspecified: Principal | ICD-10-CM

## 2015-07-11 DIAGNOSIS — I6300 Cerebral infarction due to thrombosis of unspecified precerebral artery: Secondary | ICD-10-CM

## 2015-07-11 DIAGNOSIS — Z515 Encounter for palliative care: Secondary | ICD-10-CM

## 2015-07-11 DIAGNOSIS — F0391 Unspecified dementia with behavioral disturbance: Secondary | ICD-10-CM

## 2015-07-11 LAB — HEMOGLOBIN A1C
Hgb A1c MFr Bld: 7 % — ABNORMAL HIGH (ref 4.8–5.6)
MEAN PLASMA GLUCOSE: 154 mg/dL

## 2015-07-11 LAB — URINE CULTURE: CULTURE: NO GROWTH

## 2015-07-11 LAB — GLUCOSE, CAPILLARY
GLUCOSE-CAPILLARY: 91 mg/dL (ref 65–99)
GLUCOSE-CAPILLARY: 107 mg/dL — AB (ref 65–99)
GLUCOSE-CAPILLARY: 101 mg/dL — AB (ref 65–99)
GLUCOSE-CAPILLARY: 157 mg/dL — AB (ref 65–99)
Glucose-Capillary: 114 mg/dL — ABNORMAL HIGH (ref 65–99)
Glucose-Capillary: 169 mg/dL — ABNORMAL HIGH (ref 65–99)

## 2015-07-11 LAB — LIPID PANEL
CHOL/HDL RATIO: 5.7 ratio
CHOLESTEROL: 148 mg/dL (ref 0–200)
HDL: 26 mg/dL — ABNORMAL LOW (ref >40)
LDL CALC: 87 mg/dL (ref 0–99)
TRIGLYCERIDES: 174 mg/dL — AB (ref <150)
VLDL: 35 mg/dL (ref 0–40)

## 2015-07-11 LAB — RAPID URINE DRUG SCREEN, HOSP PERFORMED
AMPHETAMINES: NOT DETECTED
BENZODIAZEPINES: POSITIVE — AB
Barbiturates: NOT DETECTED
COCAINE: NOT DETECTED
OPIATES: NOT DETECTED
TETRAHYDROCANNABINOL: NOT DETECTED

## 2015-07-11 LAB — CBC
HCT: 39 % (ref 39.0–52.0)
HEMOGLOBIN: 13.4 g/dL (ref 13.0–17.0)
MCH: 29.8 pg (ref 26.0–34.0)
MCHC: 34.4 g/dL (ref 30.0–36.0)
MCV: 86.9 fL (ref 78.0–100.0)
PLATELETS: 218 10*3/uL (ref 150–400)
RBC: 4.49 MIL/uL (ref 4.22–5.81)
RDW: 13.8 % (ref 11.5–15.5)
WBC: 8.7 10*3/uL (ref 4.0–10.5)

## 2015-07-11 MED ORDER — ASPIRIN 325 MG PO TABS
325.0000 mg | ORAL_TABLET | Freq: Every day | ORAL | Status: DC
  Administered 2015-07-11 – 2015-07-14 (×4): 325 mg via ORAL
  Filled 2015-07-11 (×4): qty 1

## 2015-07-11 MED ORDER — INSULIN ASPART 100 UNIT/ML ~~LOC~~ SOLN
0.0000 [IU] | Freq: Three times a day (TID) | SUBCUTANEOUS | Status: DC
  Administered 2015-07-12 (×2): 1 [IU] via SUBCUTANEOUS
  Administered 2015-07-13: 3 [IU] via SUBCUTANEOUS
  Administered 2015-07-13 – 2015-07-14 (×2): 1 [IU] via SUBCUTANEOUS
  Administered 2015-07-14 (×2): 2 [IU] via SUBCUTANEOUS

## 2015-07-11 MED ORDER — CLONAZEPAM 0.5 MG PO TABS
0.5000 mg | ORAL_TABLET | Freq: Every day | ORAL | Status: DC
  Administered 2015-07-11 – 2015-07-13 (×3): 0.5 mg via ORAL
  Filled 2015-07-11 (×3): qty 1

## 2015-07-11 MED ORDER — ATORVASTATIN CALCIUM 10 MG PO TABS
20.0000 mg | ORAL_TABLET | Freq: Every day | ORAL | Status: DC
  Administered 2015-07-11 – 2015-07-13 (×3): 20 mg via ORAL
  Filled 2015-07-11 (×3): qty 2

## 2015-07-11 NOTE — Evaluation (Signed)
Speech Language Pathology Evaluation Patient Details Name: Willie Anderson MRN: [id number] DOB: 10/22/1935 Today's Date: 07/11/2015 Time: 0940-1010 SLP Time Calculation (min) (ACUTE ONLY): 30 min  Problem List:  Patient Active Problem List   Diagnosis Date Noted  . Stroke (cerebrum) (HCC) 07/10/2015  . Aspiration pneumonia (HCC)   . Cerebrovascular accident (CVA) (HCC)   . Right sided weakness   . Essential hypertension   . HLD (hyperlipidemia)   . Dementia with behavioral disturbance    Past Medical History:  Past Medical History  Diagnosis Date  . Hypertension   . Diabetes mellitus without complication (HCC)   . Hyperlipemia    Past Surgical History: History reviewed. No pertinent past surgical history. HPI:  Willie Anderson is an 79 y.o. male with a past medical history significant for HTN, HLD, DM, and advanced dementia, brought in by family for evaluation of increased agitation and inability to move the right side. Patient has been living in [place] and reportedly has been increasingly agitated for the past few weeks, and at least 2 weeks ago became weak in the right side and stopped walking. Pt brought by daughter stating that they just got off a plane from [place]. MRI brain showed an acute infarct left frontal lobe.   Assessment / Plan / Recommendation Clinical Impression  Pt presents with primary cognitive deficits consistent with report of baseline dementia. Pt is alert and demonstrates intermittent focused to briefly sustained attention to verbal and functional tasks. He intermittently follows commands and responds to automatic language with occasional appropriate responses. Language and speech intelligibililty are mostly intact, though mild deficits are difficult to identify due to dementia. Suspect pts cognitive function is further impaired by lack of awareness of new physical deficits and increased confusion in an unfamiliar environment. Will continue to work  with pt to facilitate awareness and safe participation in basic ADL's.     SLP Assessment  Patient needs continued Speech Lanaguage Pathology Services    Follow Up Recommendations       Frequency and Duration min 2x/week  2 weeks   Pertinent Vitals/Pain Pain Assessment: No/denies pain   SLP Goals  Progression toward goals: Progressing toward goals Potential to Achieve Goals (ACUTE ONLY): Good  SLP Evaluation Prior Functioning  Cognitive/Linguistic Baseline: Baseline deficits Baseline deficit details: dementia   Cognition  Overall Cognitive Status: No family/caregiver present to determine baseline cognitive functioning Arousal/Alertness: Awake/alert Attention: Focused;Sustained Focused Attention: Impaired Focused Attention Impairment: Verbal basic;Functional basic Sustained Attention: Impaired Sustained Attention Impairment: Verbal basic;Functional basic Problem Solving: Impaired Behaviors: Impulsive    Comprehension  Auditory Comprehension Overall Auditory Comprehension: Impaired Commands: Impaired One Step Basic Commands: 25-49% accurate Conversation: Simple Interfering Components: Attention    Expression Verbal Expression Overall Verbal Expression: Impaired Initiation: No impairment Automatic Speech: Social Response Level of Generative/Spontaneous Verbalization: Phrase Repetition: Impaired Level of Impairment: Word level Pragmatics: Impairment Impairments: Eye contact;Topic appropriateness Interfering Components: Attention Written Expression Dominant Hand: Right   Oral / Motor Oral Motor/Sensory Function Overall Oral Motor/Sensory Function: Appears within functional limits for tasks assessed Motor Speech Overall Motor Speech: Appears within functional limits for tasks assessed   GO    Harlon DittyBonnie Patsey Pitstick, MA CCC-SLP [phone]  Claudine MoutonDeBlois, Zailey Audia Caroline 07/11/2015, 1:28 PM

## 2015-07-11 NOTE — Evaluation (Signed)
Physical Therapy Evaluation Patient Details Name: Willie Anderson MRN: [id number] DOB: October 27, 1935 Today's Date: 07/11/2015   History of Present Illness  79 yo male transferred from [place] after stroke, flew with family.  Has LEFT posterior frontal lobe infarct.   Moderate small vessel ischemia and dementia layered wtih stroke and ambulatory at baseline.  PMHx:  DM, HTN, HLD.  Clinical Impression  Pt was able to work from bedside but presents a significant fall risk, especially with his language barrier and dementia.  Will work toward SNF placement but also to increase his time OOB as can safely be set up.  May require continual onsite support to observe him.    Follow Up Recommendations SNF    Equipment Recommendations  None recommended by PT (will await SNF disposition)    Recommendations for Other Services Rehab consult     Precautions / Restrictions Precautions Precautions: Fall (telemetry) Restrictions Weight Bearing Restrictions: No      Mobility  Bed Mobility Overal bed mobility: Needs Assistance;+2 for physical assistance;+ 2 for safety/equipment Bed Mobility: Supine to Sit;Sit to Supine     Supine to sit: Total assist;+2 for physical assistance;+2 for safety/equipment;HOB elevated Sit to supine: Total assist;+2 for physical assistance;+2 for safety/equipment   General bed mobility comments: Pt cannot use UE's effectively to move and use bedrails.  Transfers Overall transfer level: Needs assistance Equipment used: 2 person hand held assist (gait belt) Transfers: Sit to/from Stand Sit to Stand: Total assist;+2 physical assistance;+2 safety/equipment;From elevated surface (Partial standing and none without hands=-on assist)            Ambulation/Gait             General Gait Details: unable to stand to attempt  Stairs            Wheelchair Mobility    Modified Rankin (Stroke Patients Only) Modified Rankin (Stroke Patients  Only) Pre-Morbid Rankin Score: No significant disability Modified Rankin: Severe disability     Balance Overall balance assessment: Needs assistance Sitting-balance support: Feet supported;Bilateral upper extremity supported Sitting balance-Leahy Scale: Poor   Postural control: Posterior lean Standing balance support: Bilateral upper extremity supported Standing balance-Leahy Scale: Zero                               Pertinent Vitals/Pain Pain Assessment: No/denies pain    Home Living Family/patient expects to be discharged to:: Skilled nursing facility                      Prior Function Level of Independence: Independent (no information about pt needing assistive device)               Hand Dominance        Extremity/Trunk Assessment   Upper Extremity Assessment: Defer to OT evaluation           Lower Extremity Assessment: Generalized weakness;RLE deficits/detail (Pt is not using RLE effectively with any tasks, focused to L) RLE Deficits / Details: No active on command movment    Cervical / Trunk Assessment: Normal  Communication   Communication: Interpreter utilized;Prefers language other than English;Expressive difficulties  Cognition Arousal/Alertness: Awake/alert Behavior During Therapy: Impulsive;Flat affect Overall Cognitive Status: Impaired/Different from baseline Area of Impairment: Safety/judgement;Awareness;Problem solving;Following commands;Attention;Memory;Orientation Orientation Level: Person (cannot determine the other parameters) Current Attention Level: Selective Memory: Decreased recall of precautions;Decreased short-term memory Following Commands: Follows one step commands inconsistently Safety/Judgement: Decreased awareness of  safety;Decreased awareness of deficits Awareness: Intellectual Problem Solving: Slow processing;Decreased initiation;Requires verbal cues;Requires tactile cues;Difficulty sequencing General  Comments: Pt was a dementia patient at baseline but no clear information about his functional level    General Comments General comments (skin integrity, edema, etc.): Pt is in bed with IV in LUE and tends to try to pull, has difficulty with LUE control of  forceful movement.  Has trouble with using for pushing up to bedside, tends to sit on L hip only but can lean to R when translated.      Exercises        Assessment/Plan    PT Assessment Patient needs continued PT services  PT Diagnosis Hemiplegia dominant side;Altered mental status   PT Problem List Decreased strength;Decreased range of motion;Decreased activity tolerance;Decreased balance;Decreased mobility;Decreased coordination;Decreased cognition;Decreased knowledge of use of DME;Decreased safety awareness;Decreased knowledge of precautions;Cardiopulmonary status limiting activity  PT Treatment Interventions DME instruction;Gait training;Functional mobility training;Therapeutic activities;Therapeutic exercise;Balance training;Neuromuscular re-education;Cognitive remediation;Patient/family education   PT Goals (Current goals can be found in the Care Plan section) Acute Rehab PT Goals Patient Stated Goal: none stated PT Goal Formulation: Patient unable to participate in goal setting Time For Goal Achievement: 07/25/15 Potential to Achieve Goals: Good    Frequency Min 4X/week   Barriers to discharge Other (comment) (2+ assistance to move will require rehab inpt to increase )      Co-evaluation               End of Session Equipment Utilized During Treatment: Gait belt Activity Tolerance: Patient tolerated treatment well;Patient limited by fatigue;Patient limited by lethargy Patient left: in bed;with call bell/phone within reach;with bed alarm set Nurse Communication: Mobility status         Time: [id number] PT Time Calculation (min) (ACUTE ONLY): 22 min   Charges:   PT Evaluation $Initial PT Evaluation Tier I:  1 Procedure     PT G CodesIvar Drape:        Santrice Muzio E 07/11/2015, 10:47 AM   Samul Dadauth Mort Smelser, PT MS Acute Rehab Dept. Number: [facility] [id number] and [facility] [phone]

## 2015-07-11 NOTE — Progress Notes (Signed)
PATIENT DETAILS Name: Willie Anderson Age: 79 y.o. Sex: male Date of Birth: November 01, 1935 Admit Date: 07/09/2015 Admitting Physician Drema Dallas, MD PCP:No primary care provider on file.  Brief narrative:    79 year old [place] male with history of advanced dementia, diabetes, hypertension, dyslipidemia-brought to [facility] form [place] for evaluation of worsening mental status and right-sided hemiparesis. MRI brain positive for an acute left frontal lobe infarct. Stroke workup in progress, remains nothing by mouth, very poor overall prognoses-after discussion with family-palliative care consulted.    Subjective:  In bed, will not communicate, does not appear to be in any distress.   Assessment/Plan:  Acute CVA: With resultant significant dysphagia, dysarthria and right-sided hemiparesis. MRI brain positive for acute left frontal lobe infarct. Carotid Doppler negative for significant stenosis. LDL 94 (goal<70) so placed on Lipitor based on his age. Pending Echocardiogram, A1c 7.2.  Does have underlying advanced dementia along with language barrier as he lived in [place] this might hinder his recovery and cooperation with PTOT and speech. For now continue PTOT speech and initiate placement.   Dysphagia: Speech therapy following currently on dysphagia 1 diet.    Leukocytosis: Afebrile, UA negative for UTI, chest x-ray negative pneumonia-is at risk for aspiration. Blood cultures negative so far. Clinically stable.   Hypertension: Allow permissive hypertension-hold lisinopril.  Dyslipidemia: Statin according to age added for LDL to be at goal.  Advanced dementia: Supportive care for now-unfortunately now with acute CVA and with poor long-term prognosis   DM type II. Place on low-dose sliding scale and monitor.    Lab Results  Component Value Date   HGBA1C 7.0* 07/10/2015    CBG (last 3)   Recent Labs  07/11/15 0422 07/11/15 0905  07/11/15 1137  GLUCAP 107* 114* 169*     Goals of care: Remains full code. Briefly spoke with patient's daughter Kasandra Knudsen over the phone-explained for long-term prognoses-explained issues regarding dysphagia/mobility and disposition. Await further goals of care discussion with palliative care team  Disposition: Remain inpatient  Antimicrobial agents  See below  Anti-infectives    None      DVT Prophylaxis: Prophylactic Heparin  Code Status: Full code   Family Communication Daugter-Monday at [place] over the phone   Procedures: None  CONSULTS:  neurology  Time spent 30 minutes-Greater than 50% of this time was spent in counseling, explanation of diagnosis, planning of further management, and coordination of care.  MEDICATIONS: Scheduled Meds: . aspirin  325 mg Oral Daily  . heparin  5,000 Units Subcutaneous 3 times per day  . insulin aspart  0-9 Units Subcutaneous 6 times per day   Continuous Infusions: . sodium chloride 75 mL/hr at 07/10/15 2152   PRN Meds:.LORazepam, senna-docusate    PHYSICAL EXAM: Vital signs in last 24 hours: Filed Vitals:   07/10/15 2055 07/11/15 0256 07/11/15 0518 07/11/15 0920  BP: 136/70 128/84 149/86 140/87  Pulse: 88 85 96 100  Temp: 98.4 F (36.9 C) 98.4 F (36.9 C) 98.7 F (37.1 C) 98.4 F (36.9 C)  TempSrc: Oral Oral Oral Axillary  Resp: Height:      Weight:      SpO2: 100% 100% 96% 98%    Weight change:  Filed Weights   07/09/15 2046  Weight: 81.647 kg (180 lb)   Body mass index is 25.83 kg/(m^2).   Gen Exam: Awake-mumbles incoherently-dysarthric speech. Does  not follow commands. Not in any distress. Neck: Suppl Chest: B/L Clear.   CVS: S1 S2 Regular, no murmurs.  Abdomen: soft, BS +, non tender, non distended.  Extremities: no edema, lower extremities warm to touch. Neurologic: right hemiparesis-unable to assess further as patient not following commands-right upper extremity appears  to be significantly weaker than the right lower extremity. Right facial droop present. Skin: No Rash.   Wounds: N/A.    Intake/Output from previous day:  Intake/Output Summary (Last 24 hours) at 07/11/15 1215 Last data filed at 07/10/15 1300  Gross per 24 hour  Intake    225 ml  Output      0 ml  Net    225 ml     LAB RESULTS: CBC  Recent Labs Lab 07/09/15 2132 07/11/15 0643  WBC 15.6* 8.7  HGB 15.0 13.4  HCT 45.0 39.0  PLT 235 218  MCV 88.2 86.9  MCH 29.4 29.8  MCHC 33.3 34.4  RDW 13.5 13.8  LYMPHSABS 2.0  --   MONOABS 0.9  --   EOSABS 0.1  --   BASOSABS 0.0  --     Chemistries   Recent Labs Lab 07/09/15 2132 07/10/15 0607  NA 132* 134*  K 4.9 4.2  CL 96* 100*  CO2 23 24  GLUCOSE 144* 137*  BUN 13 13  CREATININE 0.94 0.98  CALCIUM 9.7 9.3  MG  --  2.0    CBG:  Recent Labs Lab 07/10/15 2050 07/11/15 0104 07/11/15 0422 07/11/15 0905 07/11/15 1137  GLUCAP 117* 91 107* 114* 169*    GFR Estimated Creatinine Clearance: 65.2 mL/min (by C-G formula based on Cr of 0.98).  Coagulation profile  Recent Labs Lab 07/09/15 2132  INR 1.07    Cardiac Enzymes  Recent Labs Lab 07/09/15 2132  TROPONINI <0.03    Invalid input(s): POCBNP No results for input(s): DDIMER in the last 72 hours.  Recent Labs  07/10/15 0607  HGBA1C 7.0*    Recent Labs  07/10/15 0607 07/11/15 0643  CHOL 155 148  HDL 27* 26*  LDLCALC 94 87  TRIG 168* 174*  CHOLHDL 5.7 5.7   No results for input(s): TSH, T4TOTAL, T3FREE, THYROIDAB in the last 72 hours.  Invalid input(s): FREET3 No results for input(s): VITAMINB12, FOLATE, FERRITIN, TIBC, IRON, RETICCTPCT in the last 72 hours. No results for input(s): LIPASE, AMYLASE in the last 72 hours.  Urine Studies No results for input(s): UHGB, CRYS in the last 72 hours.  Invalid input(s): UACOL, UAPR, USPG, UPH, UTP, UGL, UKET, UBIL, UNIT, UROB, ULEU, UEPI, UWBC, URBC, UBAC, CAST, UCOM,  BILUA  MICROBIOLOGY: Recent Results (from the past 240 hour(s))  Blood culture (routine x 2)     Status: None (Preliminary result)   Collection Time: 07/09/15  9:24 PM  Result Value Ref Range Status   Specimen Description BLOOD RIGHT ARM  Final   Special Requests IN PEDIATRIC BOTTLE 2CC  Final   Culture NO GROWTH 2 DAYS  Final   Report Status PENDING  Incomplete  Blood culture (routine x 2)     Status: None (Preliminary result)   Collection Time: 07/09/15  9:29 PM  Result Value Ref Range Status   Specimen Description BLOOD RIGHT FOREARM  Final   Special Requests IN PEDIATRIC BOTTLE 2CC  Final   Culture NO GROWTH 2 DAYS  Final   Report Status PENDING  Incomplete    RADIOLOGY STUDIES/RESULTS: Ct Head Wo Contrast  07/09/2015  CLINICAL DATA:  Right-sided  weakness, altered mental status. Nonverbal. Alzheimer's. EXAM: CT HEAD WITHOUT CONTRAST TECHNIQUE: Contiguous axial images were obtained from the base of the skull through the vertex without intravenous contrast. COMPARISON:  None. FINDINGS: There is generalized brain atrophy with commensurate dilatation of the ventricles and sulci. Confluent areas of chronic small vessel ischemic change noted within the bilateral periventricular and subcortical white matter regions. Small old lacunar infarct within the right basal ganglia. There are chronic calcified atherosclerotic changes of the large vessels at the skull base. There is no mass, hemorrhage, edema, or other evidence of acute parenchymal abnormality. No extra-axial hemorrhage. No osseous abnormality. Visualized upper paranasal sinuses are clear. Mastoid air cells are clear. IMPRESSION: 1. No evidence of acute intracranial abnormality. No intracranial mass, hemorrhage, or edema. 2. Atrophy and chronic ischemic changes as detailed above. These results were called by telephone at the time of interpretation on 07/09/2015 at 9:27 pm to Dr. Azalia Bilis , who verbally acknowledged these results.  Electronically Signed   By: Bary Richard M.D.   On: 07/09/2015 21:27   Dg Chest Port 1 View  07/10/2015  CLINICAL DATA:  Aspiration pneumonia, confusion EXAM: PORTABLE CHEST 1 VIEW COMPARISON:  07/09/2015 FINDINGS: Low lung volumes. Calcified granuloma in the right upper lobe. No focal consolidation. No pleural effusion or pneumothorax. The heart is normal in size. IMPRESSION: No evidence of acute cardiopulmonary disease. Electronically Signed   By: Charline Bills M.D.   On: 07/10/2015 09:44   Dg Chest Portable 1 View  07/09/2015  CLINICAL DATA:  Altered mental status for 2 weeks. Right-sided weakness. Initial encounter. EXAM: PORTABLE CHEST 1 VIEW COMPARISON:  None. FINDINGS: The lungs are hypoexpanded. A right-sided calcified granuloma is noted. Minimal left basilar atelectasis is noted. There is no evidence of pleural effusion or pneumothorax. The cardiomediastinal silhouette is borderline normal in size. No acute osseous abnormalities are seen. IMPRESSION: Lungs hypoexpanded.  Minimal left basilar atelectasis noted. Electronically Signed   By: Roanna Raider M.D.   On: 07/09/2015 21:21   Mr Brain Ltd W/o Cm  07/09/2015  CLINICAL DATA:  RIGHT-sided weakness after plane ride from [place] Altered mental status for 2 weeks, aggressive, unable to walk. History of advanced at Alzheimer's disease. EXAM: MRI HEAD WITHOUT CONTRAST TECHNIQUE: Axial diffusion weighted imaging, sagittal T1 and axial T2 propeller. Patient was unable to tolerate further imaging due to agitation. COMPARISON:  CT head July 09, 2015 at 2114 hours. FINDINGS: Severely motion degraded axial T2 sequence, moderately motion degraded sagittal T1. 12 x 15 mm area of reduced diffusion LEFT posterior frontal lobe, with normalizing ADC values, and T2 bright signal. Moderate ventriculomegaly on the basis of global parenchymal brain volume loss better seen on prior CT. Patchy to confluent supratentorial white matter T2  hyperintensities. No midline shift. No abnormal extra-axial fluid collections. Major intracranial vascular flow voids present at the skullbase. No abnormal sellar expansion. No cerebellar tonsillar ectopia. No suspicious calvarial bone marrow signal. IMPRESSION: Limited motion degraded MRI, patient unable to complete the examination due to agitation. 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 At least moderate global parenchymal brain volume loss and moderate chronic small vessel ischemic disease. Electronically Signed   By: Awilda Metro M.D.   On: 07/09/2015 23:38    Leroy Sea, MD  [facility] Pager:[phone]  If 7PM-7AM, please contact night-coverage [email or web address] Password [email or web address] 07/11/2015, 12:15 PM   LOS: 1 day

## 2015-07-11 NOTE — Evaluation (Signed)
Occupational Therapy Evaluation Patient Details Name: Willie Anderson MRN: [id number] DOB: [birth year] Today's Date: 07/11/2015    History of Present Illness 79 yo male transferred from [place] after stroke, flew with family.  Has LEFT posterior frontal lobe infarct.   Moderate small vessel ischemia and dementia layered wtih stroke and ambulatory at baseline.  PMHx:  DM, HTN, HLD.   Clinical Impression   This 79 yo male admitted with above presents to acute OT with decreased cognition pta, decreased balance,decreaed mobility, decreased insight into deficits all affecting his ability to care for himself or A with his care. He will benefit from acute OT with follow up at SNF.    Follow Up Recommendations  SNF    Equipment Recommendations   (TBD at next venue)       Precautions / Restrictions Precautions Precautions: Fall Restrictions Weight Bearing Restrictions: No      Mobility Bed Mobility Overal bed mobility: Needs Assistance;+2 for physical assistance;+ 2 for safety/equipment Bed Mobility: Supine to Sit;Sit to Supine     Supine to sit: Total assist;+2 for physical assistance;+2 for safety/equipment;HOB elevated Sit to supine: Total assist;+2 for physical assistance;+2 for safety/equipment   General bed mobility comments: Pt cannot use UE's effectively to move and use bedrails.  Transfers Overall transfer level: Needs assistance Equipment used: 2 person hand held assist (gait belt) Transfers: Sit to/from Stand Sit to Stand: Total assist;+2 physical assistance;+2 safety/equipment;From elevated surface         General transfer comment: only able to partially stand (maybe a couple of inches off of the bed)    Balance Overall balance assessment: Needs assistance Sitting-balance support: Feet supported;Single extremity supported Sitting balance-Leahy Scale: Poor Sitting balance - Comments: varied between mod to min A Postural control: Posterior lean  (slow) Standing balance support: Bilateral upper extremity supported Standing balance-Leahy Scale: Zero                              ADL Overall ADL's : Needs assistance/impaired                                       General ADL Comments: At this time pt is a total A due to dementia, new right sided weakness     Vision Additional Comments: To be further assessed in functional context, unsure what prior vision was  (no family in room)          Pertinent Vitals/Pain Pain Assessment: No/denies pain     Hand Dominance  (unknown--pt could not tell us and no family in room )   Extremity/Trunk Assessment Upper Extremity Assessment Upper Extremity Assessment: RUE deficits/detail;LUE deficits/detail RUE Deficits / Details: minimal movement noted--GM RUE Coordination: decreased fine motor;decreased gross motor LUE Deficits / Details: resists at times with LUE  as well as unaware from talking with SLP how much pressure he is putting on cup (would collapse them) LUE Sensation: decreased proprioception LUE Coordination: decreased gross motor;decreased fine motor   Lower Extremity Assessment Lower Extremity Assessment: Generalized weakness;RLE deficits/detail (Pt is not using RLE effectively with any tasks, focused to L) RLE Deficits / Details: No active on command movment RLE:  (dementia restricts evaluation)   Cervical / Trunk Assessment Cervical / Trunk Assessment: Normal   Communication Communication Communication: Interpreter utilized;Expressive difficulties;Receptive difficulties;Prefers language other than English   Cognition Arousal/Alertness: Awake/alert Behavior  During Therapy: Impulsive;Flat affect Overall Cognitive Status: No family/caregiver present to determine baseline cognitive functioning Area of Impairment: Safety/judgement;Awareness;Problem solving;Following commands;Attention;Memory Orientation Level: Person (cannot determine the other  parameters) Current Attention Level: Sustained Memory: Decreased recall of precautions;Decreased short-term memory Following Commands: Follows one step commands inconsistently Safety/Judgement: Decreased awareness of safety;Decreased awareness of deficits Awareness: Intellectual Problem Solving: Slow processing;Decreased initiation;Requires verbal cues;Requires tactile cues General Comments: Pt with h/o dementia at baseline per chart, but no clear information in chart about his functional level pta              Home Living Family/patient expects to be discharged to:: Skilled nursing facility                                        Prior Functioning/Environment Level of Independence: Independent (no information about pt needing assistive device)        Comments: Unsure, no family available and pt brought here from [place]    OT Diagnosis: Generalized weakness;Cognitive deficits;Disturbance of vision;Hemiplegia dominant side   OT Problem List: Decreased strength;Decreased range of motion;Decreased activity tolerance;Impaired balance (sitting and/or standing);Impaired sensation;Decreased safety awareness;Decreased cognition;Impaired UE functional use;Impaired vision/perception;Decreased knowledge of use of DME or AE;Impaired tone   OT Treatment/Interventions: Patient/family education;Self-care/ADL training;Visual/perceptual remediation/compensation;Balance training;Neuromuscular education;Therapeutic activities;DME and/or AE instruction    OT Goals(Current goals can be found in the care plan section) Acute Rehab OT Goals Patient Stated Goal: unable OT Goal Formulation: Patient unable to participate in goal setting Time For Goal Achievement: 07/24/16 Potential to Achieve Goals: Fair  OT Frequency: Min 3X/week   Barriers to D/C:  (unknown how much support he has at home)             End of Session Equipment Utilized During Treatment: Gait belt Nurse  Communication:  (Issued a mug with lid for pt to use due to decreased proprioception in LUE)  Activity Tolerance: Patient limited by fatigue Patient left: in bed;with call bell/phone within reach;with bed alarm set   Time: [id number] OT Time Calculation (min): 22 min Charges:  OT General Charges $OT Visit: 1 Procedure OT Evaluation $Initial OT Evaluation Tier I: 1 Procedure  Evette GeorgesLeonard, Zylon Creamer Eva [phone] 07/11/2015, 11:37 AM

## 2015-07-11 NOTE — Progress Notes (Signed)
Echocardiogram 2D Echocardiogram has been performed.  Willie Anderson, [name] M 07/11/2015, 10:51 AM

## 2015-07-11 NOTE — Consult Note (Addendum)
Consultation Note Date: 07/11/2015   Patient Name: Willie Anderson  DOB: July 25, 1936  MRN: [id number]  Age / Sex: 79 y.o., male  PCP: No primary care provider on file. Referring Physician: Thurnell Lose, MD  Reason for Consultation: Establishing goals of care    Clinical Assessment/Narrative: 79 year old [place] male with history of advanced dementia, diabetes, hypertension, dyslipidemia-brought to [facility] form [place] for evaluation of worsening mental status and right-sided hemiparesis. MRI brain positive for an acute left frontal lobe infarct. Stroke workup in progress and he was cleared for dysphagia diet today.  He continues to have periods of fluctuating participation with examiners.  He does seem to be more awake today and he was much better in ability to participate with speech therapy today.    I met with his daughter, Willie Anderson, to discuss his overall course.  She works at [employer] in [employer] and reports that she has had limited insight into her fathers baseline as she has lived in [place] while her father has lived in some sort of group home in [place].   Contacts/Participants in Discussion: Patients daughter.  Patient does not speak english.  His wife speaks limited english Primary Decision Maker: Patient's family makes decisions together.  His daughter serves as spokesperson   HCPOA: No  SUMMARY OF RECOMMENDATIONS - I discussed with his daughter the results of testing to this point.  She was happy to hear that he has been upgraded to a dysphagia diet. - She seems to have good understanding of his current clinical situation, and I again discussed with her the fact that with his underlying dementia complicated by acute CVA, there is a high likelihood of continued complications. - We talked about her fathers cognition, functional status, and cognition being indicators of how he is  doing moving forward. - Short term goal is to recover as much as possible and be out of the hospital to spend time with his wife.  She states that she is hoping to get him from the hospital to SNF for rehab.  She cannot care for him at her home and will be long term placement. - I began conversations regarding long term [place] and that we should focus care moving forward on enabling him to spend time with family outside the hospital while limiting interventions not in line with this goal.  No change to his plan of care at this time. - He would be well-served by completion of a MOST form prior to discharge.  Code Status/Advance Care Planning: Full code    Code Status Orders        Start     Ordered   07/10/15 0023  Full code   Continuous     07/10/15 0026      Other Directives:None at this time  Symptom Management:   No active symptoms  Palliative Prophylaxis:   Aspiration, Bowel Regimen and Delirium Protocol  Psycho-social/Spiritual:  Support System: Fair through family.  With his advances dementia this is difficult.  He does not recognize his daughter or grandchildren Desire for further Chaplaincy support:No Additional Recommendations: None at this time  Prognosis: Unable to determine at this time as still in acute recovery following CVA  Discharge Planning: [place] for rehab with Palliative care service follow-up   Chief Complaint/ Primary Diagnoses: Present on Admission:  . Stroke (cerebrum) ([place]) . Aspiration pneumonia ([place]) . Cerebrovascular accident (CVA) ([place]) . Right sided weakness . Essential hypertension . HLD (hyperlipidemia)  I  have reviewed the medical record, interviewed the patient and family, and examined the patient. The following aspects are pertinent.  Past Medical History  Diagnosis Date  . Hypertension   . Diabetes mellitus without complication ([place])   . Hyperlipemia    Social History   Social History  . Marital Status:  Married    Spouse Name: N/A  . Number of Children: N/A  . Years of Education: N/A   Social History Main Topics  . Smoking status: Never Smoker   . Smokeless tobacco: None  . Alcohol Use: 3.6 oz/week    6 Cans of beer per week     Comment: non past year  . Drug Use: No  . Sexual Activity: Not Asked   Other Topics Concern  . None   Social History Narrative  . None   History reviewed. No pertinent family history. Scheduled Meds: . aspirin  325 mg Oral Daily  . atorvastatin  20 mg Oral q1800  . clonazePAM  0.5 mg Oral QHS  . heparin  5,000 Units Subcutaneous 3 times per day  . insulin aspart  0-9 Units Subcutaneous TID WC   Continuous Infusions: . sodium chloride 75 mL/hr at 07/10/15 2152   PRN Meds:.LORazepam, senna-docusate Medications Prior to Admission:  Prior to Admission medications   Medication Sig Start Date End Date Taking? Authorizing Provider  clonazePAM (KLONOPIN) 2 MG tablet Take 2 mg by mouth 2 (two) times daily.   Yes Historical Provider, MD  furosemide (LASIX) 20 MG tablet Take 20 mg by mouth daily.   Yes Historical Provider, MD  lisinopril (PRINIVIL,ZESTRIL) 10 MG tablet Take 10 mg by mouth daily.   Yes Historical Provider, MD  metFORMIN (GLUCOPHAGE) 500 MG tablet Take 500 mg by mouth daily with breakfast.   Yes Historical Provider, MD  simvastatin (ZOCOR) 10 MG tablet Take 10 mg by mouth daily.   Yes Historical Provider, MD  temazepam (RESTORIL) 15 MG capsule Take 15 mg by mouth at bedtime.   Yes Historical Provider, MD   No Known Allergies CBC:    Component Value Date/Time   WBC 8.7 07/11/2015 0643   HGB 13.4 07/11/2015 0643   HCT 39.0 07/11/2015 0643   PLT 218 07/11/2015 0643   MCV 86.9 07/11/2015 0643   NEUTROABS 12.7* 07/09/2015 2132   LYMPHSABS 2.0 07/09/2015 2132   MONOABS 0.9 07/09/2015 2132   EOSABS 0.1 07/09/2015 2132   BASOSABS 0.0 07/09/2015 2132   Comprehensive Metabolic Panel:    Component Value Date/Time   NA 134* 07/10/2015 0607    K 4.2 07/10/2015 0607   CL 100* 07/10/2015 0607   CO2 24 07/10/2015 0607   BUN 13 07/10/2015 0607   CREATININE 0.98 07/10/2015 0607   GLUCOSE 137* 07/10/2015 0607   CALCIUM 9.3 07/10/2015 0607   AST 25 07/10/2015 0607   ALT 19 07/10/2015 0607   ALKPHOS 82 07/10/2015 0607   BILITOT 0.8 07/10/2015 0607   PROT 6.6 07/10/2015 0607   ALBUMIN 3.2* 07/10/2015 0607    Review of Systems  Unable to perform ROS: Mental status change    Physical Exam  Gen Exam: Awake. Does not follow commands. Not in any distress.  Neck: Suppl  Chest: B/L Clear.  CVS: S1 S2 Regular, no murmurs.  Abdomen: soft, BS +, non tender, non distended.  Extremities: no edema, lower extremities warm to touch.  Neurologic: right hemiparesis-unable to assess further as patient not following commands-right upper extremity appears to be significantly weaker than the right  lower extremity. Right facial droop present.  Skin: No Rash.  Wounds: N/A.    Vital Signs: BP 134/75 mmHg  Pulse 87  Temp(Src) 98.3 F (36.8 C) (Axillary)  Resp 20  Ht _0  (1.778 m)  Wt 81.647 kg (180 lb)  BMI 25.83 kg/m2  SpO2 100% SpO2: Last BM Date:  (prior to arrival; pt disoriented)  O2 Device:SpO2: 100 % O2 Flow Rate: .  Intake/output summary:  Intake/Output Summary (Last 24 hours) at 07/11/15 2308 Last data filed at 07/11/15 1828  Gross per 24 hour  Intake    220 ml  Output    500 ml  Net   -280 ml   LBM:  BMP Latest Ref Rng 07/10/2015 07/09/2015  Glucose 65 - 99 mg/dL 137(H) 144(H)  BUN 6 - 20 mg/dL 13 13  Creatinine 0.61 - 1.24 mg/dL 0.98 0.94  Sodium 135 - 145 mmol/L 134(L) 132(L)  Potassium 3.5 - 5.1 mmol/L 4.2 4.9  Chloride 101 - 111 mmol/L 100(L) 96(L)  CO2 22 - 32 mmol/L 24 23  Calcium 8.9 - 10.3 mg/dL 9.3 9.7    Baseline Weight: Weight: 81.647 kg (180 lb) Most recent weight: Weight: 81.647 kg (180 lb)      Palliative Assessment/Data:  Flowsheet Rows        Most Recent Value   Intake Tab    Referral  Department  Hospitalist   Unit at Time of Referral  Med/Surg Unit   Palliative Care Primary Diagnosis  Neurology   Date Notified  07/10/15   Palliative Care Type  New Palliative care   Reason for referral  Clarify Goals of Care   Date of Admission  07/09/15   Date first seen by Palliative Care  07/10/15   # of days Palliative referral response time  0 Day(s)   # of days IP prior to Palliative referral  1   Clinical Assessment    Psychosocial & Spiritual Assessment    Palliative Care Outcomes       Additional Data Reviewed: Recent Labs     07/09/15  2132  07/10/15  0607  07/11/15  0643  WBC  15.6*   --   8.7  HGB  15.0   --   13.4  PLT  235   --   218  NA  132*  134*   --   BUN  13  13   --   CREATININE  0.94  0.98   --     Time In: 1020 Time Out: 1120 Time Total: 60 Greater than 50%  of this time was spent counseling and coordinating care related to the above assessment and plan.  Signed by: Micheline Rough, MD  Micheline Rough, MD  07/11/2015, 11:08 PM  Please contact Palliative Medicine Team phone at [phone] for questions and concerns.

## 2015-07-11 NOTE — Progress Notes (Signed)
STROKE TEAM PROGRESS NOTE   SUBJECTIVE (INTERVAL HISTORY) No family at bedside. Communication difficult. RN had nothing new to add.   OBJECTIVE Temp:  [97.5 F (36.4 C)-98.7 F (37.1 C)] 98.4 F (36.9 C) (10/31 0920) Pulse Rate:  [76-100] 100 (10/31 0920) Cardiac Rhythm:  [-] Normal sinus rhythm (10/31 0700) Resp:  [18-20] 20 (10/31 0920) BP: (94-149)/(70-87) 140/87 mmHg (10/31 0920) SpO2:  [96 %-100 %] 98 % (10/31 0920)  CBC:  Recent Labs Lab 07/09/15 2132 07/11/15 0643  WBC 15.6* 8.7  NEUTROABS 12.7*  --   HGB 15.0 13.4  HCT 45.0 39.0  MCV 88.2 86.9  PLT 235 218    Basic Metabolic Panel:  Recent Labs Lab 07/09/15 2132 07/10/15 0607  NA 132* 134*  K 4.9 4.2  CL 96* 100*  CO2 23 24  GLUCOSE 144* 137*  BUN 13 13  CREATININE 0.94 0.98  CALCIUM 9.7 9.3  MG  --  2.0    Lipid Panel:    Component Value Date/Time   CHOL 148 07/11/2015 0643   TRIG 174* 07/11/2015 0643   HDL 26* 07/11/2015 0643   CHOLHDL 5.7 07/11/2015 0643   VLDL 35 07/11/2015 0643   LDLCALC 87 07/11/2015 0643   HgbA1c:  Lab Results  Component Value Date   HGBA1C 7.0* 07/10/2015   Urine Drug Screen:    Component Value Date/Time   LABOPIA NONE DETECTED 07/11/2015 1203   COCAINSCRNUR NONE DETECTED 07/11/2015 1203   LABBENZ POSITIVE* 07/11/2015 1203   AMPHETMU NONE DETECTED 07/11/2015 1203   THCU NONE DETECTED 07/11/2015 1203   LABBARB NONE DETECTED 07/11/2015 1203      IMAGING  Ct Head Wo Contrast 07/09/2015  1. No evidence of acute intracranial abnormality. No intracranial mass, hemorrhage, or edema.  2. Atrophy and chronic ischemic changes as detailed above.   Dg Chest Port 1 View 07/10/2015  No evidence of acute cardiopulmonary disease.   Dg Chest Portable 1 View 07/09/2015  Lungs hypoexpanded. Minimal left basilar atelectasis noted.   Mr Brain Ltd W/o Cm 07/09/2015  Limited motion degraded MRI, patient unable to complete the examination due to agitation.  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 At least moderate global parenchymal brain volume loss and moderate chronic small vessel ischemic disease.   MRI Brain with contrast  pending   2D Echocardiogram - Left ventricle: Wall thickness was increased in a pattern of mildLVH. Systolic function was vigorous. The estimated ejectionfraction was in the range of 65% to 70%. Wall motion was normal;there were no regional wall motion abnormalities. Dopplerparameters are consistent with abnormal left ventricularrelaxation (grade 1 diastolic dysfunction). - Aortic valve: Mildly calcified annulus.   PHYSICAL EXAM General - Well nourished, well developed elderly male, in NAD; very confused and mumbling; neck appears supple on exam Cardiovascular - Regular rate and rhythm Pulmonary: CTA Abdomen: NT, ND, normal bowel sounds Extremities: No C/C/E  Neurological Exam Mental Status: Confused, mumbling. Does say his name but does not follow commands Orientation: Unable to assess Speech: Unable to assess. Fluent; no dysarthria Cranial Nerves: PERRL; EOMI; visual fields ful, to threat face grossly symmetric, hearing grossly intact; remainder of exam limited by mental status Motor Exam:  Tone: [place] Motor/Sensory: withdraws to noxious stimuli x4 extremities Coordination: Intact finger to nose Gait: Deferred   ASSESSMENT/PLAN [name] is a 79 y.o. male with history of hypertension, diabetes mellitus, and hyperlipidemia, and advanced dementia presenting with increased agitation and right hemiparesis. He did not  receive IV t-PA due to late presentation.   Stroke: Dominant L posterior frontal lobe infarct secondary to small vessel disease source (Dr. Pearlean BrownieSethi does not suspect any other etiology other than stroke/ischemia)  MRI  Stroke: Possible Dominant L posterior frontal lobe infarct secondary to small vessel disease  MRA    Carotid Doppler  No  significant stenosis   2D Echo  No source of embolus   LE venous dopplers given recent air travel  LDL 87  HgbA1c 7.0  Heparin 5000 units sq tid for VTE prophylaxis  DIET - DYS 1 Room service appropriate?: Yes; Fluid consistency:: Thin  No antithrombotic prior to admission, now on aspirin 325 mg daily  Ongoing aggressive stroke risk factor management  Therapy recommendations:  SNF  Disposition:  pending   Hypertension  Blood pressure runs low - not on antihypertensive medications at this time.  Hyperlipidemia  Home meds:  Simvastatin 10,   LDL 87, goal < 70  Now on lipitor 20  Continue statin at discharge  Diabetes  HgbA1c 7.0, just over goal < 7.0  Other Stroke Risk Factors  Advanced age  ETOH use  Other Active Problems  Advanced dementia  Leukocytosis, resolved  Hospital day # 1  WillieAnderson  [facility] See Amion for Pager information 07/11/2015 2:16 PM  I have personally examined this patient, reviewed notes, independently viewed imaging studies, participated in medical decision making and plan of care. I have made any additions or clarifications directly to the above note. Agree with note above.    Willie HeadyPramod Sethi, MD Medical Director [facility] Pager: [phone] 07/11/2015 3:09 PM    To contact Stroke Continuity provider, please refer to [email or web address]. After hours, contact General Neurology

## 2015-07-11 NOTE — Care Management Note (Signed)
Case Management Note  Patient Details  Name: Simeon Craftrinidad Picchi MRN: [id number] Date of Birth: 03/27/1936  Subjective/Objective:                    Action/Plan: Patient admitted with CVA. Patient recently to [place]. Patient does not speak English and is staying with family. Patient has no PCP on file and no insurance but is being followed by Morrie SheldonAshley with financial counseling. Family meeting with Palliative Care today to discuss goals of care. Awaiting PT/OT recommendations for discharge disposition. CM will continue to follow for discharge needs.   Expected Discharge Date:                  Expected Discharge Plan:  Skilled Nursing Facility  In-House Referral:     Discharge planning Services     Post Acute Care Choice:    Choice offered to:     DME Arranged:    DME Agency:     HH Arranged:    HH Agency:     Status of Service:  In process, will continue to follow  Medicare Important Message Given:    Date Medicare IM Given:    Medicare IM give by:    Date Additional Medicare IM Given:    Additional Medicare Important Message give by:     If discussed at Long Length of Stay Meetings, dates discussed:    Additional Comments:  Kermit BaloKelli F Nachman Sundt, RN 07/11/2015, 10:23 AM

## 2015-07-11 NOTE — Progress Notes (Signed)
Speech Language Pathology Treatment: Dysphagia  Patient Details Name: Willie Anderson MRN: [id number] DOB: 04/19/1936 Today's Date: 07/11/2015 Time: 0940-1010 SLP Time Calculation (min) (ACUTE ONLY): 30 min  Assessment / Plan / Recommendation Clinical Impression  Pt demonstrated improved sustained attention with max multimodal cues from SLP during PO trials. With set up for self feeding and hand over hand assist pt independently reached for the cup and SLP further provided hand over hand assist for sips and bites of yogurt. The pt did have a grip reflex while holding cup x2, crushing the cup. OT facilitated a hard plastic mug with a lid for safer self feeding. There was no evidence of aspiration with large consecutive straw sips of thin liquids. Recommend initiating a dys 1/thin liquid diet though consistency will be a concern. Discussed with MD and left daughter a message. Will f/u for tolerance.    HPI Other Pertinent Information: Willie Anderson is an 79 y.o. male with a past medical history significant for HTN, HLD, DM, and advanced dementia, brought in by family for evaluation of increased agitation and inability to move the right side. Patient has been living in [place] and reportedly has been increasingly agitated for the past few weeks, and at least 2 weeks ago became weak in the right side and stopped walking. Pt brought by daughter stating that they just got off a plane from [place]. MRI brain showed an acute infarct left frontal lobe.   Pertinent Vitals Pain Assessment: No/denies pain  SLP Plan  Continue with current plan of care    Recommendations Diet recommendations: Thin liquid;Dysphagia 1 (puree) Liquids provided via: Straw;Cup Medication Administration: Crushed with puree Supervision: Full supervision/cueing for compensatory strategies;Staff to assist with self feeding Compensations: Slow rate Postural Changes and/or Swallow Maneuvers: Seated upright 90 degrees             Oral Care Recommendations: Oral care BID Plan: Continue with current plan of care    GO    [facility] SystemBonnie Valisha Heslin, MA CCC-SLP [phone]  Claudine MoutonDeBlois, Willie Anderson Willie Anderson 07/11/2015, 12:32 PM

## 2015-07-12 ENCOUNTER — Inpatient Hospital Stay (HOSPITAL_COMMUNITY): Payer: Medicare (Managed Care)

## 2015-07-12 DIAGNOSIS — I639 Cerebral infarction, unspecified: Secondary | ICD-10-CM

## 2015-07-12 LAB — GLUCOSE, CAPILLARY
GLUCOSE-CAPILLARY: 146 mg/dL — AB (ref 65–99)
GLUCOSE-CAPILLARY: 127 mg/dL — AB (ref 65–99)
Glucose-Capillary: 115 mg/dL — ABNORMAL HIGH (ref 65–99)
Glucose-Capillary: 139 mg/dL — ABNORMAL HIGH (ref 65–99)

## 2015-07-12 MED ORDER — TAMSULOSIN HCL 0.4 MG PO CAPS
0.4000 mg | ORAL_CAPSULE | Freq: Every day | ORAL | Status: DC
  Administered 2015-07-12 – 2015-07-14 (×3): 0.4 mg via ORAL
  Filled 2015-07-12 (×3): qty 1

## 2015-07-12 MED ORDER — GLUCERNA SHAKE PO LIQD
237.0000 mL | Freq: Three times a day (TID) | ORAL | Status: DC
  Administered 2015-07-12 – 2015-07-14 (×6): 237 mL via ORAL
  Filled 2015-07-12 (×10): qty 237

## 2015-07-12 NOTE — Progress Notes (Signed)
Daily Progress Note   Patient Name: Willie Anderson       Date: 07/12/2015 DOB: 1935-10-20  Age: 79 y.o. MRN#: [id number] Attending Physician: Thurnell Lose, MD Primary Care Physician: No primary care provider on file. Admit Date: 07/09/2015  Reason for Consultation/Follow-up: Establishing goals of care  Subjective: 79 year old [place] male with history of advanced dementia, diabetes, hypertension, dyslipidemia-brought to [facility] form [place] for evaluation of worsening mental status and right-sided hemiparesis. MRI brain positive for an acute left frontal lobe infarct. Stroke workup in progress and he was cleared for dysphagia diet.  Continues to have periods of waxing and waning mental status and ability to participate in therapy.  Interval Events: Willie Anderson remains limited in Willie ability to meaningfully interact at times.  I met with Willie daughter today who reports that he is saying that Willie appetite has improved some today.    She and I had another long discussion today as outlined below.  Length of Stay: 2 days  Current Medications: Scheduled Meds:  . aspirin  325 mg Oral Daily  . atorvastatin  20 mg Oral q1800  . clonazePAM  0.5 mg Oral QHS  . feeding supplement (GLUCERNA SHAKE)  237 mL Oral TID BM  . heparin  5,000 Units Subcutaneous 3 times per day  . insulin aspart  0-9 Units Subcutaneous TID WC  . tamsulosin  0.4 mg Oral Daily    Continuous Infusions: . sodium chloride 75 mL/hr at 07/12/15 1338    PRN Meds: LORazepam, senna-docusate  Palliative Performance Scale: 40%     Vital Signs: BP 155/70 mmHg  Pulse 89  Temp(Src) 99.1 F (37.3 C) (Axillary)  Resp 16  Ht 5' 10"  (1.778 m)  Wt 81.647 kg (180 lb)  BMI 25.83 kg/m2  SpO2 97% SpO2: SpO2: 97 % O2 Device: O2 Device: Not Delivered O2 Flow Rate:    Intake/output summary:  Intake/Output Summary (Last 24 hours) at 07/12/15 2038 Last data filed at 07/12/15 1400  Gross per 24 hour    Intake      0 ml  Output    775 ml  Net   -775 ml   LBM:   Baseline Weight: Weight: 81.647 kg (180 lb) Most recent weight: Weight: 81.647 kg (180 lb)  Physical Exam: Gen Exam: Awake. Does not follow commands. Not in any distress.  Neck: Suppl  Chest: B/L Clear.  CVS: S1 S2 Regular, no murmurs.  Abdomen: soft, BS +, non tender, non distended.  Extremities: no edema, lower extremities warm to touch.  Neurologic: right hemiparesis-unable to assess further as patient not following commands-right upper extremity appears to be significantly weaker than the right lower extremity. Right facial droop present.  Skin: No Rash.  Wounds: N/A.               Additional Data Reviewed: Recent Labs     07/09/15  2132  07/10/15  0607  07/11/15  0643  WBC  15.6*   --   8.7  HGB  15.0   --   13.4  PLT  235   --   218  NA  132*  134*   --   BUN  13  13   --   CREATININE  0.94  0.98   --      Problem List:  Patient Active Problem List   Diagnosis Date Noted  . Stroke (cerebrum) ([place]) 07/10/2015  . Aspiration pneumonia ([place])   . Cerebrovascular accident (  CVA) ([place])   . Right sided weakness   . Essential hypertension   . HLD (hyperlipidemia)   . Dementia with behavioral disturbance      Palliative Care Assessment & Plan    Code Status:  Full code  Goals of Care:  I had a long conversation with the patient's daughter, Bonnita Anderson, today.  We discussed her father's clinical course to this point in time as well as pathways moving forward.  She has been feeling a lot of stress regarding making decisions about her father's care, and, while her mother is present, Bonnita Anderson feels that her mother really doesn't completely understand the situation.  We talked about Willie long standing chronic illnesses (notably dementia) that are further complicated by Willie acute stroke.  We discussed the declines in Willie functional status, nutrition, and cognition that were occuring even prior to this stroke.   We talked about Willie high risk for continued decline and development of acute complications.  We discussed that the challenge moving forward is going to be focusing Willie care on interventions that are in line with what she believes would be Willie goals moving forward (spending time with Willie wife and being out of the hospital).  I introduced a MOST form and attempted to discuss advance care planning further.  Willie daughter became overwhelmed and reports that she is going to need time to speak with her mother regarding this.  I provided her with a copy of "Hard Choices for Loving People" to review.  Her overall goals is to continue to encourage a path of rehabilitation with plans for long term placement at a SNF facility.  Symptom Management: No active symptoms this time Psycho-social/Spiritual: Desire for further Chaplaincy support: no  Prognosis: Unable to determine Discharge Planning: [place] was discussed with patient's daughter  Thank you for allowing the Palliative Medicine Team to assist in the care of this patient.   Time In: 1525 Time Out: 1610 Total Time 45 Prolonged Time Billed No     Greater than 50%  of this time was spent counseling and coordinating care related to the above assessment and plan.   Micheline Rough, MD  07/12/2015, 8:38 PM  Please contact Palliative Medicine Team phone at [phone] for questions and concerns.

## 2015-07-12 NOTE — Progress Notes (Signed)
Speech Language Pathology Treatment: Dysphagia  Patient Details Name: Willie Anderson MRN: [id number] DOB: 07/18/1936 Today's Date: 07/12/2015 Time: [id number] SLP Time Calculation (min) (ACUTE ONLY): 15 min  Assessment / Plan / Recommendation Clinical Impression  Pt was seen for skilled ST targeting dysphagia goals.  SLP completed skilled observations with presentations of pt's currently prescribed diet to assess for toleration.  Pt consumed dys 1 textures and thin liquids with max assist for rate and portion control.  No overt s/s of aspiration were evident with purees or thin liquids despite large boluses.  Pt remains limited by cognition; as a result, recommend that pt remain on dys 1 textures and thin liquids with full supervision for use of swallowing precautions and to assist in self feeding.  SLP will continue to follow up for toleration and to assess readiness for upgrade.      HPI Other Pertinent Information: Willie Anderson is an 79 y.o. male with a past medical history significant for HTN, HLD, DM, and advanced dementia, brought in by family for evaluation of increased agitation and inability to move the right side. Patient has been living in [place] and reportedly has been increasingly agitated for the past few weeks, and at least 2 weeks ago became weak in the right side and stopped walking. Pt brought by daughter stating that they just got off a plane from [place]. MRI brain showed an acute infarct left frontal lobe.   Pertinent Vitals Pain Assessment: No/denies pain  SLP Plan  Continue with current plan of care    Recommendations Diet recommendations: Dysphagia 1 (puree);Thin liquid Liquids provided via: Cup;Straw Medication Administration: Crushed with puree Supervision: Full supervision/cueing for compensatory strategies;Staff to assist with self feeding Compensations: Slow rate Postural Changes and/or Swallow Maneuvers: Seated upright 90 degrees              Oral  Care Recommendations: Oral care BID Follow up Recommendations: 24 hour supervision/assistance Plan: Continue with current plan of care    GO     Orvin Netter, Melanee Spryicole L 07/12/2015, 11:54 AM

## 2015-07-12 NOTE — Clinical Social Work Note (Signed)
Clinical Social Work Assessment  Patient Details  Name: Willie Anderson MRN: [id number] Date of Birth: 09/23/1935  Date of referral:  07/12/15               Reason for consult:  Facility Placement, Discharge Planning                Permission sought to share information with:  Facility Industrial/product designerContact Representative Permission granted to share information::  Yes, Verbal Permission Granted  Name::      (daughter, Willie Anderson)  Agency::   (SNF)  Relationship::   (daughter, Willie Anderson)  SolicitorContact Information:   Willie Jacobson(Helen - [phone])  Housing/Transportation Living arrangements for the past 2 months:  Single Family Home (From [place]. recently moved to the area with daughter) Source of Information:  Adult Children Patient Interpreter Needed:  None Criminal Activity/Legal Involvement Pertinent to Current Situation/Hospitalization:  No - Comment as needed Significant Relationships:  Adult Children, Spouse Lives with:  Spouse Do you feel safe going back to the place where you live?  No Need for family participation in patient care:  Yes (Comment)  Care giving concerns:   Patient's daughter, Willie Anderson expressed home safety concerns for patient upon discharge. Patient's daughter stated that she will need for her father to be placed permanantely at a facility because she will be unable to care for him.  Social Worker assessment / plan:   Patient disoriented x4. BSW intern has spoken with patient's daughter, Willie Anderson by phone to discuss discharge disposition. BSW intern made patient's daughter aware of the SNF placement recommendations which were made by PT. Patient's daughter informed BSW intern that patient is from [place] and [place] [facility] [place]. In the process of moving, patient became ill due to stroke. Patient's daughter stated that because of the patient's current condition, she is unable to care for him any longer.Patient's daughter was not agreeable for patient  to be placed for short-term rehab. Patient's daughter expressed need for patient to be placed at SNF long term if possible. Patient daughter was agreeable to [employer] CoBSW intern faxing patient's clinicals to SNF facilities. Patient's daughter requested that BSW intern fax patient clinicals to facilities [place] [facility] [place] so that it would be easier for her to visit patient. BSW intern explained SNF process to patient's daughter. Patient's daughter also had concerns in reference of patient's current insurance coverage. BSW intern to follow up with patient's daughter regarding these concerns. BSW intern remains available as needed.  Employment status:  Retired Health and safety inspectornsurance information:  Medicare PT Recommendations:  Skilled Nursing Facility Information / Referral to community resources:  Skilled Nursing Facility  Patient/Family's Response to care:   Patient's daughter was appropriate and understanding during BSW intern's assessment.   Patient/Family's Understanding of and Emotional Response to Diagnosis, Current Treatment, and Prognosis:   Patient's daughter was very understandable of patient's current need for [facility].   Emotional Assessment Appearance:   (unable to assess) Attitude/Demeanor/Rapport:  Unable to Assess Affect (typically observed):  Unable to Assess Orientation:   (disoriented x4) Alcohol / Substance use:  Not Applicable Psych involvement (Current and /or in the community):  No (Comment)  Discharge Needs  Concerns to be addressed:  Discharge Planning Concerns, Home Safety Concerns Readmission within the last 30 days:  No Current discharge risk:  None Barriers to Discharge:  No Barriers Identified   Willie Anderson, Student-SW 07/12/2015, 9:56 AM

## 2015-07-12 NOTE — Progress Notes (Signed)
VASCULAR LAB PRELIMINARY  PRELIMINARY  PRELIMINARY  PRELIMINARY  Bilateral lower extremity venous duplex completed.    Preliminary report:  Bilateral:  No obvious  evidence of DVT, superficial thrombosis, or Baker's Cyst. There was mild technical difficulty due to patient grabbing the probe and the technicians hand during the compression phase of the exam due to pain.   Britteney Ayotte, RVS 07/12/2015, 1:08 PM

## 2015-07-12 NOTE — Progress Notes (Signed)
STROKE TEAM PROGRESS NOTE   SUBJECTIVE (INTERVAL HISTORY) No family at bedside. Patient just back from vascular lab. He states his name and that he is hispanic.   OBJECTIVE Temp:  [98.1 F (36.7 C)-99.4 F (37.4 C)] 98.1 F (36.7 C) (11/01 0557) Pulse Rate:  [75-101] 75 (11/01 0557) Cardiac Rhythm:  [-] Normal sinus rhythm (11/01 0700) Resp:  [18-20] 18 (11/01 0900) BP: (117-144)/(61-77) 144/77 mmHg (11/01 0900) SpO2:  [99 %-100 %] 100 % (11/01 0557)  CBC:   Recent Labs Lab 07/09/15 2132 07/11/15 0643  WBC 15.6* 8.7  NEUTROABS 12.7*  --   HGB 15.0 13.4  HCT 45.0 39.0  MCV 88.2 86.9  PLT 235 218    Basic Metabolic Panel:   Recent Labs Lab 07/09/15 2132 07/10/15 0607  NA 132* 134*  K 4.9 4.2  CL 96* 100*  CO2 23 24  GLUCOSE 144* 137*  BUN 13 13  CREATININE 0.94 0.98  CALCIUM 9.7 9.3  MG  --  2.0    Lipid Panel:     Component Value Date/Time   CHOL 148 07/11/2015 0643   TRIG 174* 07/11/2015 0643   HDL 26* 07/11/2015 0643   CHOLHDL 5.7 07/11/2015 0643   VLDL 35 07/11/2015 0643   LDLCALC 87 07/11/2015 0643   HgbA1c:  Lab Results  Component Value Date   HGBA1C 7.0* 07/10/2015   Urine Drug Screen:     Component Value Date/Time   LABOPIA NONE DETECTED 07/11/2015 1203   COCAINSCRNUR NONE DETECTED 07/11/2015 1203   LABBENZ POSITIVE* 07/11/2015 1203   AMPHETMU NONE DETECTED 07/11/2015 1203   THCU NONE DETECTED 07/11/2015 1203   LABBARB NONE DETECTED 07/11/2015 1203      IMAGING  Ct Head Wo Contrast 07/09/2015  1. No evidence of acute intracranial abnormality. No intracranial mass, hemorrhage, or edema.  2. Atrophy and chronic ischemic changes as detailed above.   Dg Chest Port 1 View 07/10/2015  No evidence of acute cardiopulmonary disease.   Dg Chest Portable 1 View 07/09/2015  Lungs hypoexpanded. Minimal left basilar atelectasis noted.   Mr Brain Ltd W/o Cm 07/09/2015  Limited motion degraded MRI, patient unable to complete  the examination due to agitation. 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 At least moderate global parenchymal brain volume loss and moderate chronic small vessel ischemic disease.   2D Echocardiogram - Left ventricle: Wall thickness was increased in a pattern of mildLVH. Systolic function was vigorous. The estimated ejectionfraction was in the range of 65% to 70%. Wall motion was normal;there were no regional wall motion abnormalities. Dopplerparameters are consistent with abnormal left ventricularrelaxation (grade 1 diastolic dysfunction). - Aortic valve: Mildly calcified annulus.  LE venous doppler Bilateral: No obvious evidence of DVT, superficial thrombosis, or Baker's Cyst. There was mild technical difficulty due to patient grabbing the probe and the technicians hand during the compression phase of the exam due to pain.   PHYSICAL EXAM General - Well nourished, well developed elderly male, in NAD; very confused and mumbling; neck appears supple on exam Cardiovascular - Regular rate and rhythm Pulmonary: CTA Abdomen: NT, ND, normal bowel sounds Extremities: No C/C/E  Neurological Exam Mental Status: Confused, mumbling. Does say his name but does not follow commands Orientation: Unable to assess Speech: Unable to assess. Fluent; no dysarthria Cranial Nerves: PERRL; EOMI; visual fields ful, to threat face grossly symmetric, hearing grossly intact; remainder of exam limited by mental status Motor Exam:  Tone: [place] Motor/Sensory: withdraws to  noxious stimuli x4 extremities Coordination: Intact finger to nose Gait: Deferred   ASSESSMENT/PLAN [name] is a 79 y.o. [place] male with history of hypertension, diabetes mellitus, and hyperlipidemia, and advanced dementia presenting with increased agitation and right hemiparesis. He did not receive IV t-PA due to late presentation.   Stroke: Dominant L posterior frontal lobe  infarct secondary to small vessel disease source (Dr. Pearlean BrownieSethi does not suspect any other etiology other than stroke/ischemia)  MRI  Stroke: Possible Dominant L posterior frontal lobe infarct secondary to small vessel disease  Cancel MRI with contrast. Do not feel it will add/change the plan of care in this severely demented guy  Carotid Doppler  No significant stenosis   2D Echo  No source of embolus   LE venous dopplers negative for VTE  LDL 87  HgbA1c 7.0  Heparin 5000 units sq tid for VTE prophylaxis DIET - DYS 1 Room service appropriate?: Yes; Fluid consistency:: Thin  No antithrombotic prior to admission, now on aspirin 325 mg daily  Ongoing aggressive stroke risk factor management  Therapy recommendations:  SNF  Disposition:  pending   Agree with plans for palliative care  NOTHING FURTHER TO ADD FROM THE STROKE STANDPOINT  Patient has a 10-15% risk of having another stroke over the next year, the highest risk is within 2 weeks of the most recent stroke/TIA (risk of having a stroke following a stroke or TIA is the same).  Ongoing risk factor control by Primary Care Physician  Stroke Service will sign off. Please call should any needs arise.  [facility] with Dr. Delia HeadyPramod Kialee Kham in 2 months, order placed.   Hypertension  Blood pressure runs low - not on antihypertensive medications at this time.  Hyperlipidemia  Home meds:  Simvastatin 10,   LDL 87, goal < 70  Now on lipitor 20  Continue statin at discharge  Diabetes  HgbA1c 7.0, just over goal < 7.0  Other Stroke Risk Factors  Advanced age  ETOH use  Other Active Problems  Advanced dementia  Leukocytosis, resolved  Hospital day # 2  Willie Anderson,Willie Anderson  [name] See Amion for Pager information 07/12/2015 1:22 PM  I have personally examined this patient, reviewed notes, independently viewed imaging studies, participated in medical decision making  and plan of care. I have made any additions or clarifications directly to the above note. Agree with note above. Discuss with Dr.Singh. medical hospitalist. Patient's prognosis is quite poor given baseline dementia and now stroke and I'm not sure at this time aggressive stroke evaluation is beneficial. Patient's family is unfortunately not available for discussion. Stroke team will sign off. I will call for questions.  Delia HeadyPramod Cullen Lahaie, MD Medical Director [facility] Pager: [phone] 07/12/2015 3:21 PM   To contact Stroke Continuity provider, please refer to [email or web address]. After hours, contact General Neurology

## 2015-07-12 NOTE — [place] FL2 (Signed)
[place] MEDICAID FL2 LEVEL OF CARE SCREENING TOOL     IDENTIFICATION  Patient Name: Willie Anderson Birthdate: October 10, 1935 Sex: male Admission Date (Current Location): 07/09/2015  [place] and [place] Number:  [facility] )   Facility and Address:  [facility], [address], [place], [place] [address]      Provider Number: [id number]  Attending Physician Name and Address:  Leroy Sea, MD  Relative Name and Phone Number:   Laural Roes [phone])    Current Level of Care: Hospital Recommended Level of Care: Skilled Nursing Facility Prior Approval Number:    Date Approved/Denied:   PASRR Number:    Discharge Plan: SNF    Current Diagnoses: Patient Active Problem List   Diagnosis Date Noted  . Stroke (cerebrum) (HCC) 07/10/2015  . Aspiration pneumonia (HCC)   . Cerebrovascular accident (CVA) (HCC)   . Right sided weakness   . Essential hypertension   . HLD (hyperlipidemia)   . Dementia with behavioral disturbance     Orientation ACTIVITIES/SOCIAL BLADDER RESPIRATION     (Disoriented x4)  Family supportive Continent Normal  BEHAVIORAL SYMPTOMS/MOOD NEUROLOGICAL BOWEL NUTRITION STATUS   (None)  (None ) Continent Diet (DYS 1)  PHYSICIAN VISITS COMMUNICATION OF NEEDS Height & Weight Skin    Verbally  (177.8 cm) 180 lbs. Normal          AMBULATORY STATUS RESPIRATION    Assist extensive Normal      Personal Care Assistance Level of Assistance  Bathing, Dressing Bathing Assistance: Maximum assistance   Dressing Assistance: Maximum assistance      Functional Limitations Info   (None )             SPECIAL CARE FACTORS FREQUENCY  OT (By licensed OT), PT (By licensed PT)     PT Frequency: 4 OT Frequency: 3           Additional Factors Info  Code Status, Allergies Code Status Info: Full Code  Allergies Info: N/A           Current Medications (07/12/2015): Current Facility-Administered  Medications  Medication Dose Route Frequency Provider Last Rate Last Dose  . 0.9 %  sodium chloride infusion   Intravenous Continuous Drema Dallas, MD 75 mL/hr at 07/10/15 2152    . aspirin tablet 325 mg  325 mg Oral Daily Leroy Sea, MD   325 mg at 07/12/15 1046  . atorvastatin (LIPITOR) tablet 20 mg  20 mg Oral q1800 Leroy Sea, MD   20 mg at 07/11/15 1807  . clonazePAM (KLONOPIN) tablet 0.5 mg  0.5 mg Oral QHS Leroy Sea, MD   0.5 mg at 07/11/15 2251  . heparin injection 5,000 Units  5,000 Units Subcutaneous 3 times per day Drema Dallas, MD   5,000 Units at 07/11/15 2251  . insulin aspart (novoLOG) injection 0-9 Units  0-9 Units Subcutaneous TID WC Leroy Sea, MD   0 Units at 07/11/15 1710  . LORazepam (ATIVAN) injection 0.5 mg  0.5 mg Intravenous Q12H PRN Maretta Bees, MD      . senna-docusate (Senokot-S) tablet 1 tablet  1 tablet Oral QHS PRN Drema Dallas, MD       Do not use this list as official medication orders. Please verify with discharge summary.  Discharge Medications:   Medication List    ASK your doctor about these medications        clonazePAM 2 MG tablet  Commonly known as:  KLONOPIN  Take 2 mg by mouth 2 (two) times daily.     furosemide 20 MG tablet  Commonly known as:  LASIX  Take 20 mg by mouth daily.     lisinopril 10 MG tablet  Commonly known as:  PRINIVIL,ZESTRIL  Take 10 mg by mouth daily.     metFORMIN 500 MG tablet  Commonly known as:  GLUCOPHAGE  Take 500 mg by mouth daily with breakfast.     simvastatin 10 MG tablet  Commonly known as:  ZOCOR  Take 10 mg by mouth daily.     temazepam 15 MG capsule  Commonly known as:  RESTORIL  Take 15 mg by mouth at bedtime.        Relevant Imaging Results:  Relevant Lab Results:  Recent Labs    Additional Information  (SS# [id number])  Vaughan BrownerNixon, Miley Lindon A, LCSW

## 2015-07-12 NOTE — Progress Notes (Addendum)
PATIENT DETAILS Name: Willie Anderson Age: 79 y.o. Sex: male Date of Birth: 1935-12-27 Admit Date: 07/09/2015 Admitting Physician Drema Dallas, MD PCP:No primary care provider on file.  Brief narrative:    79 year old [place] male with history of advanced dementia, diabetes, hypertension, dyslipidemia-brought to [facility] [place] for evaluation of worsening mental status and right-sided hemiparesis. MRI brain positive for an acute left frontal lobe infarct. Stroke workup in progress, remains nothing by mouth, very poor overall prognoses-after discussion with family-palliative care consulted.    Subjective:  In bed, will not communicate, does not appear to be in any distress.   Assessment/Plan:  Acute CVA: With resultant significant dysphagia, dysarthria and right-sided hemiparesis. MRI brain positive for acute left frontal lobe infarct. LDL 94 (goal<70) so placed on Lipitor based on his age. Stable echogram and carotid duplex,  A1c 7.2. Neuro following.  Does have underlying advanced dementia along with language barrier as he lived in [place] this might hinder his recovery and cooperation with PTOT and speech. For now continue PTOT speech and initiate placement.   Dysphagia: Speech therapy following currently on dysphagia 1 diet.    Leukocytosis: Afebrile, UA negative for UTI, chest x-ray negative pneumonia-is at risk for aspiration. Blood cultures negative so far. Clinically stable.   Hypertension: Allow permissive hypertension-hold lisinopril.  Dyslipidemia: Statin according to age added for LDL to be at goal.  Advanced dementia: Supportive care for now-unfortunately now with acute CVA and with poor long-term prognosis   DM type II. Place on low-dose sliding scale and monitor.    Lab Results  Component Value Date   HGBA1C 7.0* 07/10/2015    CBG (last 3)   Recent Labs  07/11/15 2156 07/12/15 0648 07/12/15 1134    GLUCAP 157* 115* 139*     Goals of care: Remains full code. Briefly spoke with patient's daughter Kasandra Knudsen over the phone-explained for long-term prognoses-explained issues regarding dysphagia/mobility and disposition. Await further goals of care discussion with palliative care team   Disposition: Remain inpatient  Antimicrobial agents  See below  Anti-infectives    None      DVT Prophylaxis: Prophylactic Heparin  Code Status: Full code   Family Communication Patient's daughter over the phone   Procedures:  TTE  - Left ventricle: Wall thickness was increased in a pattern of mildLVH. Systolic function was vigorous. The estimated ejectionfraction was in the range of 65% to 70%. Wall motion was normal;there were no regional wall motion abnormalities. Dopplerparameters are consistent with abnormal left ventricular relaxation (grade 1 diastolic dysfunction).  Aortic valve: Mildly calcified annulus.   Carotids - Preliminary report: Bilateral: 1-39% ICA stenosis. Vertebral artery flow is antegrade.   Bilateral lower extremity venous duplex. No blood clots.   CONSULTS:  neurology  Time spent 30 minutes-Greater than 50% of this time was spent in counseling, explanation of diagnosis, planning of further management, and coordination of care.  MEDICATIONS: Scheduled Meds: . aspirin  325 mg Oral Daily  . atorvastatin  20 mg Oral q1800  . clonazePAM  0.5 mg Oral QHS  . heparin  5,000 Units Subcutaneous 3 times per day  . insulin aspart  0-9 Units Subcutaneous TID WC   Continuous Infusions: . sodium chloride 75 mL/hr at 07/10/15 2152   PRN Meds:.LORazepam, senna-docusate    PHYSICAL EXAM: Vital signs in last 24 hours: Filed Vitals:   07/11/15 2159 07/12/15 0210  07/12/15 0557 07/12/15 0900  BP: 134/75 140/72 117/62 144/77  Pulse: 87 88 75   Temp: 98.3 F (36.8 C) 98.2 F (36.8 C) 98.1 F (36.7 C)   TempSrc: Axillary Axillary Axillary   Resp:  20 20 18 18   Height:      Weight:      SpO2: 100% 100% 100%     Weight change:  Filed Weights   07/09/15 2046  Weight: 81.647 kg (180 lb)   Body mass index is 25.83 kg/(m^2).   Gen Exam: Awake-mumbles incoherently-dysarthric speech. Does not follow commands. Not in any distress. Neck: Suppl Chest: B/L Clear.   CVS: S1 S2 Regular, no murmurs.  Abdomen: soft, BS +, non tender, non distended.  Extremities: no edema, lower extremities warm to touch. Neurologic: right hemiparesis-unable to assess further as patient not following commands-right upper extremity appears to be significantly weaker than the right lower extremity. Right facial droop present. Skin: No Rash.   Wounds: N/A.    Intake/Output from previous day:  Intake/Output Summary (Last 24 hours) at 07/12/15 1224 Last data filed at 07/11/15 2340  Gross per 24 hour  Intake    220 ml  Output    400 ml  Net   -180 ml     LAB RESULTS: CBC  Recent Labs Lab 07/09/15 2132 07/11/15 0643  WBC 15.6* 8.7  HGB 15.0 13.4  HCT 45.0 39.0  PLT 235 218  MCV 88.2 86.9  MCH 29.4 29.8  MCHC 33.3 34.4  RDW 13.5 13.8  LYMPHSABS 2.0  --   MONOABS 0.9  --   EOSABS 0.1  --   BASOSABS 0.0  --     Chemistries   Recent Labs Lab 07/09/15 2132 07/10/15 0607  NA 132* 134*  K 4.9 4.2  CL 96* 100*  CO2 23 24  GLUCOSE 144* 137*  BUN 13 13  CREATININE 0.94 0.98  CALCIUM 9.7 9.3  MG  --  2.0    CBG:  Recent Labs Lab 07/11/15 1137 07/11/15 1631 07/11/15 2156 07/12/15 0648 07/12/15 1134  GLUCAP 169* 101* 157* 115* 139*    GFR Estimated Creatinine Clearance: 65.2 mL/min (by C-G formula based on Cr of 0.98).  Coagulation profile  Recent Labs Lab 07/09/15 2132  INR 1.07    Cardiac Enzymes  Recent Labs Lab 07/09/15 2132  TROPONINI <0.03    Invalid input(s): POCBNP No results for input(s): DDIMER in the last 72 hours.  Recent Labs  07/10/15 0607  HGBA1C 7.0*    Recent Labs  07/10/15 0607  07/11/15 0643  CHOL 155 148  HDL 27* 26*  LDLCALC 94 87  TRIG 168* 174*  CHOLHDL 5.7 5.7   No results for input(s): TSH, T4TOTAL, T3FREE, THYROIDAB in the last 72 hours.  Invalid input(s): FREET3 No results for input(s): VITAMINB12, FOLATE, FERRITIN, TIBC, IRON, RETICCTPCT in the last 72 hours. No results for input(s): LIPASE, AMYLASE in the last 72 hours.  Urine Studies No results for input(s): UHGB, CRYS in the last 72 hours.  Invalid input(s): UACOL, UAPR, USPG, UPH, UTP, UGL, UKET, UBIL, UNIT, UROB, ULEU, UEPI, UWBC, URBC, UBAC, CAST, UCOM, BILUA  MICROBIOLOGY: Recent Results (from the past 240 hour(s))  Blood culture (routine x 2)     Status: None (Preliminary result)   Collection Time: 07/09/15  9:24 PM  Result Value Ref Range Status   Specimen Description BLOOD RIGHT ARM  Final   Special Requests IN PEDIATRIC BOTTLE [facility]CC  Final   Culture  NO GROWTH 2 DAYS  Final   Report Status PENDING  Incomplete  Blood culture (routine x 2)     Status: None (Preliminary result)   Collection Time: 07/09/15  9:29 PM  Result Value Ref Range Status   Specimen Description BLOOD RIGHT FOREARM  Final   Special Requests IN PEDIATRIC BOTTLE 2CC  Final   Culture NO GROWTH 2 DAYS  Final   Report Status PENDING  Incomplete  Urine culture     Status: None   Collection Time: 07/09/15 10:10 PM  Result Value Ref Range Status   Specimen Description URINE, RANDOM  Final   Special Requests NONE  Final   Culture NO GROWTH 1 DAY  Final   Report Status 07/11/2015 FINAL  Final    RADIOLOGY STUDIES/RESULTS: Ct Head Wo Contrast  07/09/2015  CLINICAL DATA:  Right-sided weakness, altered mental status. Nonverbal. Alzheimer's. EXAM: CT HEAD WITHOUT CONTRAST TECHNIQUE: Contiguous axial images were obtained from the base of the skull through the vertex without intravenous contrast. COMPARISON:  None. FINDINGS: There is generalized brain atrophy with commensurate dilatation of the ventricles and sulci.  Confluent areas of chronic small vessel ischemic change noted within the bilateral periventricular and subcortical white matter regions. Small old lacunar infarct within the right basal ganglia. There are chronic calcified atherosclerotic changes of the large vessels at the skull base. There is no mass, hemorrhage, edema, or other evidence of acute parenchymal abnormality. No extra-axial hemorrhage. No osseous abnormality. Visualized upper paranasal sinuses are clear. Mastoid air cells are clear. IMPRESSION: 1. No evidence of acute intracranial abnormality. No intracranial mass, hemorrhage, or edema. 2. Atrophy and chronic ischemic changes as detailed above. These results were called by telephone at the time of interpretation on 07/09/2015 at 9:27 pm to Dr. Azalia Bilis , who verbally acknowledged these results. Electronically Signed   By: Bary Richard M.D.   On: 07/09/2015 21:27   Dg Chest Port 1 View  07/10/2015  CLINICAL DATA:  Aspiration pneumonia, confusion EXAM: PORTABLE CHEST 1 VIEW COMPARISON:  07/09/2015 FINDINGS: Low lung volumes. Calcified granuloma in the right upper lobe. No focal consolidation. No pleural effusion or pneumothorax. The heart is normal in size. IMPRESSION: No evidence of acute cardiopulmonary disease. Electronically Signed   By: Charline Bills M.D.   On: 07/10/2015 09:44   Dg Chest Portable 1 View  07/09/2015  CLINICAL DATA:  Altered mental status for 2 weeks. Right-sided weakness. Initial encounter. EXAM: PORTABLE CHEST 1 VIEW COMPARISON:  None. FINDINGS: The lungs are hypoexpanded. A right-sided calcified granuloma is noted. Minimal left basilar atelectasis is noted. There is no evidence of pleural effusion or pneumothorax. The cardiomediastinal silhouette is borderline normal in size. No acute osseous abnormalities are seen. IMPRESSION: Lungs hypoexpanded.  Minimal left basilar atelectasis noted. Electronically Signed   By: Roanna Raider M.D.   On: 07/09/2015 21:21   Mr  [employer] W/o Cm  07/09/2015  CLINICAL DATA:  RIGHT-sided weakness after plane ride from [place] Altered mental status for 2 weeks, aggressive, unable to walk. History of advanced at Alzheimer's disease. EXAM: MRI HEAD WITHOUT CONTRAST TECHNIQUE: Axial diffusion weighted imaging, sagittal T1 and axial T2 propeller. Patient was unable to tolerate further imaging due to agitation. COMPARISON:  CT head July 09, 2015 at 2114 hours. FINDINGS: Severely motion degraded axial T2 sequence, moderately motion degraded sagittal T1. 12 x 15 mm area of reduced diffusion LEFT posterior frontal lobe, with normalizing ADC values, and T2 bright signal. Moderate ventriculomegaly on  the basis of global parenchymal brain volume loss better seen on prior CT. Patchy to confluent supratentorial white matter T2 hyperintensities. No midline shift. No abnormal extra-axial fluid collections. Major intracranial vascular flow voids present at the skullbase. No abnormal sellar expansion. No cerebellar tonsillar ectopia. No suspicious calvarial bone marrow signal. IMPRESSION: Limited motion degraded MRI, patient unable to complete the examination due to agitation. 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 At least moderate global parenchymal brain volume loss and moderate chronic small vessel ischemic disease. Electronically Signed   By: Awilda Metro M.D.   On: 07/09/2015 23:38    Leroy Sea, MD  [facility] Pager:[phone]  If 7PM-7AM, please contact night-coverage [email or web address] Password [email or web address] 07/12/2015, 12:24 PM   LOS: 2 days

## 2015-07-12 NOTE — Care Management Important Message (Signed)
Important Message  Patient Details  Name: Simeon Craftrinidad Lore MRN: [id number] Date of Birth: 12/01/1935   Medicare Important Message Given:  Yes-second notification given    Kyla BalzarineShealy, Robt Okuda Abena 07/12/2015, 10:17 AM

## 2015-07-12 NOTE — Clinical Social Work Note (Signed)
[facility] contacted CSW and indicated facility is NOT in contract with insurance carrier. [facility] unable to extend bed offer. CSW has notified daughter. [place].  CSW remains available as needed.    Derenda FennelBashira Shmuel Girgis, MSW, LCSWA [phone] 07/12/2015 4:54 PM

## 2015-07-12 NOTE — Clinical Social Work Placement (Signed)
   CLINICAL SOCIAL WORK PLACEMENT  NOTE  Date:  07/12/2015  Patient Details  Name: Simeon Craftrinidad Tanzi MRN: [id number] Date of Birth: 11/20/1935  Clinical Social Work is seeking post-discharge placement for this patient at the Skilled  Nursing Facility level of care (*CSW will initial, date and re-position this form in  chart as items are completed):  Yes   Patient/family provided with [place] Clinical Social Work Department's list of facilities offering this level of care within the geographic area requested by the patient (or if unable, by the patient's family).  Yes   Patient/family informed of their freedom to choose among providers that offer the needed level of care, that participate in Medicare, Medicaid or managed care program needed by the patient, have an available bed and are willing to accept the patient.  Yes   Patient/family informed of [facility]'s ownership interest in [facility] and [facility], as well as of the fact that they are under no obligation to receive care at these facilities.  PASRR submitted to EDS on       PASRR number received on       Existing PASRR number confirmed on       FL2 transmitted to all facilities in geographic area requested by pt/family on       FL2 transmitted to all facilities within larger geographic area on       Patient informed that his/her managed care company has contracts with or will negotiate with certain facilities, including the following:            Patient/family informed of bed offers received.  Patient chooses bed at       Physician recommends and patient chooses bed at      Patient to be transferred to   on  .  Patient to be transferred to facility by       Patient family notified on   of transfer.  Name of family member notified:        PHYSICIAN       Additional Comment:    _______________________________________________ Orson Gearatiana Lucky Alverson, Student-SW 07/12/2015, 9:50 AM

## 2015-07-13 DIAGNOSIS — Z515 Encounter for palliative care: Secondary | ICD-10-CM | POA: Insufficient documentation

## 2015-07-13 DIAGNOSIS — Z7189 Other specified counseling: Secondary | ICD-10-CM | POA: Insufficient documentation

## 2015-07-13 LAB — GLUCOSE, CAPILLARY
GLUCOSE-CAPILLARY: 126 mg/dL — AB (ref 65–99)
GLUCOSE-CAPILLARY: 150 mg/dL — AB (ref 65–99)
GLUCOSE-CAPILLARY: 132 mg/dL — AB (ref 65–99)
Glucose-Capillary: 89 mg/dL (ref 65–99)

## 2015-07-13 MED ORDER — TAMSULOSIN HCL 0.4 MG PO CAPS: 0.4000 mg | ORAL_CAPSULE | Freq: Every day | ORAL | Status: AC

## 2015-07-13 MED ORDER — INSULIN ASPART 100 UNIT/ML ~~LOC~~ SOLN: SUBCUTANEOUS | Status: AC

## 2015-07-13 MED ORDER — ATORVASTATIN CALCIUM 20 MG PO TABS: 20.0000 mg | ORAL_TABLET | Freq: Every day | ORAL | Status: AC

## 2015-07-13 MED ORDER — GLUCERNA SHAKE PO LIQD: 237.0000 mL | Freq: Three times a day (TID) | ORAL | Status: AC

## 2015-07-13 MED ORDER — ASPIRIN 325 MG PO TABS: 325.0000 mg | ORAL_TABLET | Freq: Every day | ORAL | Status: AC

## 2015-07-13 MED ORDER — CLONAZEPAM 0.5 MG PO TABS: 2.0000 mg | ORAL_TABLET | Freq: Every day | ORAL | Status: AC

## 2015-07-13 MED ORDER — DONEPEZIL HCL 5 MG PO TABS: 5.0000 mg | ORAL_TABLET | Freq: Every day | ORAL | Status: AC

## 2015-07-13 MED ORDER — DONEPEZIL HCL 5 MG PO TABS
5.0000 mg | ORAL_TABLET | Freq: Every day | ORAL | Status: DC
  Administered 2015-07-13: 5 mg via ORAL
  Filled 2015-07-13: qty 1

## 2015-07-13 NOTE — Care Management Note (Signed)
Case Management Note  Patient Details  Name: Willie Anderson MRN: [id number] Date of Birth: 08/24/1936  Subjective/Objective:                    Action/Plan: Plan is to discharge patient today to SNF. No further needs per CM.   Expected Discharge Date:                  Expected Discharge Plan:  Skilled Nursing Facility  In-House Referral:     Discharge planning Services     Post Acute Care Choice:    Choice offered to:     DME Arranged:    DME Agency:     HH Arranged:    HH Agency:     Status of Service:  In process, will continue to follow  Medicare Important Message Given:  Yes-second notification given Date Medicare IM Given:    Medicare IM give by:    Date Additional Medicare IM Given:    Additional Medicare Important Message give by:     If discussed at Long Length of Stay Meetings, dates discussed:    Additional Comments:  [name], RN 07/13/2015, 11:01 AM

## 2015-07-13 NOTE — Clinical Social Work Note (Signed)
Patient has been faxed out to different SNFs, patient still does not have a bed available, CSW continuing to try to find SNF for patient.  CSW updated patient's daughter Myriam JacobsonHelen.  Ervin KnackEric R. Semaj Coburn, MSW, LCSWA [phone] 07/13/2015 12:00 PM

## 2015-07-13 NOTE — Discharge Summary (Addendum)
Willie Anderson, is a 79 y.o. male  DOB 09-Sep-1936  MRN [id number].  Admission date:  07/09/2015  Admitting Physician  Drema Dallas, MD  Discharge Date:  07/14/2015   Primary MD  No primary care provider on file.  Recommendations for primary care physician for things to follow:   Dysphagia 1 diet with feeding assistance and aspiration precautions. Continued speech, PTOT.  Check CBC, BMP and monitor glycemic control closely.  Outpatient neurology follow-up.   Admission Diagnosis  Aspiration pneumonia (HCC) [J69.0] Stroke (cerebrum) (HCC) [I63.9] Right sided weakness [M62.89] Cerebrovascular accident (CVA), unspecified mechanism (HCC) [I63.9]   Discharge Diagnosis  Aspiration pneumonia (HCC) [J69.0] Stroke (cerebrum) (HCC) [I63.9] Right sided weakness [M62.89] Cerebrovascular accident (CVA), unspecified mechanism (HCC) [I63.9]    Active Problems:   Stroke (cerebrum) (HCC)   Aspiration pneumonia (HCC)   Cerebrovascular accident (CVA) (HCC)   Right sided weakness   Essential hypertension   HLD (hyperlipidemia)   Dementia with behavioral disturbance   Goals of care, counseling/discussion   Palliative care encounter      Past Medical History  Diagnosis Date  . Hypertension   . Diabetes mellitus without complication (HCC)   . Hyperlipemia     History reviewed. No pertinent past surgical history.     HPI  from the history and physical done on the day of admission:    Willie Anderson is a 79 y.o. HM PMHx HTN, HLD, Diabetes type 2  Patient [place] in [place] and was brought to the [place] this evening by his daughter on an airplane as the daughter reports that he was having increasing confusion and agitation over the past several weeks while in [place] and over the past several days  has had more limited mobility. She states that her father has a history of dementia as well as diabetes, hypertension, hyperlipidemia. She states that the physicians in [place] could not explain to her why he was having worsening symptoms over the past several weeks and that she flew to [place] and brought him back to the [place]. She reports that normally he can talk but the patient has not been communicating. Daughters also reported drooling coming out of his mouth. She reports that normally he needs some assistance to put his clothes on but he can ambulate. She came immediately from the airport to the emergency department. No reports of vomiting or diarrhea. No reports of recent fever as far she knows.     Hospital Course:     Acute CVA: With resultant significant dysphagia, dysarthria and right-sided hemiparesis. MRI brain positive for acute left frontal lobe infarct. LDL 94 (goal<70) so placed on Lipitor based on his age. Stable echogram and carotid duplex, A1c 7.2. Neuro the patient and will follow post discharge.  Does have underlying advanced dementia along with language barrier as he lived in [place] this might hinder his recovery and cooperation with PTOT and speech. Require placement to SNF for continued speech and PTOT follow-up.   Dysphagia: Speech  therapy following currently on dysphagia 1 diet. Continue speech therapy follow-up at SNF along with feeding assistance and aspiration precautions.  Hypertension: Tomorrow home dose lisinopril and monitor blood pressure along with BMP.  Dyslipidemia: Statin according to age added for LDL to be at goal.  Advanced dementia: Supportive care for now-unfortunately now with acute CVA and with poor long-term prognosis, will place on Aricept.   Right groin early herpes. Started on 07/14/2015. placed on acyclovir for 7 days along with triple antibiotic cream to prevent secondary bacterial infection.   Mild urinary  retention. Resolved with Flomax. He remains incontinent for which condom catheter will be placed.   DM type II. Place on low-dose sliding scale and continue before meals and at bedtime. Monitor CBGs are just as needed.   CBG (last 3)   Recent Labs  07/13/15 1800 07/13/15 2101 07/14/15 0630  GLUCAP 132* 89 123*    Lab Results  Component Value Date   HGBA1C 7.0* 07/10/2015      Discharge Condition: Fair  Follow UP  Follow-up Information    Follow up with SETHI,PRAMOD, MD In 2 months.   Specialties:  Neurology, Radiology   Why:  Office will call you with appointment date & time   Contact information:   [address] [place] [address] [phone]       Follow up with [facility]. Schedule an appointment as soon as possible for a visit in 1 week.   Contact information:   [address] [facility] [id number] [phone]       Consults obtained - Neuro  Diet and Activity recommendation: See Discharge Instructions below  Discharge Instructions           Discharge Instructions    Ambulatory referral to Neurology    Complete by:  As directed   Please schedule post stroke follow up in 2 months.     Discharge instructions    Complete by:  As directed   Follow with Primary MD in 7 days   Get CBC, CMP, 2 view Chest X ray checked  by Primary MD next visit.    Activity: As tolerated with Full fall precautions use walker/cane & assistance as needed   Disposition SNF   Diet: Dysphagia 1 with feeding assistance and aspiration precautions.  For Heart failure patients - Check your Weight same time everyday, if you gain over 2 pounds, or you develop in leg swelling, experience more shortness of breath or chest pain, call your Primary MD immediately. Follow Cardiac Low Salt Diet and 1.5 lit/day fluid restriction.   On your next visit with your primary care physician please Get Medicines reviewed and  adjusted.   Please request your Prim.MD to go over all Hospital Tests and Procedure/Radiological results at the follow up, please get all Hospital records sent to your Prim MD by signing hospital release before you go home.   If you experience worsening of your admission symptoms, develop shortness of breath, life threatening emergency, suicidal or homicidal thoughts you must seek medical attention immediately by calling 911 or calling your MD immediately  if symptoms less severe.  You Must read complete instructions/literature along with all the possible adverse reactions/side effects for all the Medicines you take and that have been prescribed to you. Take any new Medicines after you have completely understood and accpet all the possible adverse reactions/side effects.   Do not drive, operating heavy machinery, perform activities at heights, swimming  or participation in water activities or provide baby sitting services if your were admitted for syncope or siezures until you have seen by Primary MD or a Neurologist and advised to do so again.  Do not drive when taking Pain medications.    Do not take more than prescribed Pain, Sleep and Anxiety Medications  Special Instructions: If you have smoked or chewed Tobacco  in the last 2 yrs please stop smoking, stop any regular Alcohol  and or any Recreational drug use.  Wear Seat belts while driving.   Please note  You were cared for by a hospitalist during your hospital stay. If you have any questions about your discharge medications or the care you received while you were in the hospital after you are discharged, you can call the unit and asked to speak with the hospitalist on call if the hospitalist that took care of you is not available. Once you are discharged, your primary care physician will handle any further medical issues. Please note that NO REFILLS for any discharge medications will be authorized once you are discharged, as it is  imperative that you return to your primary care physician (or establish a relationship with a primary care physician if you do not have one) for your aftercare needs so that they can reassess your need for medications and monitor your lab values.     Increase activity slowly    Complete by:  As directed              Discharge Medications       Medication List    STOP taking these medications        furosemide 20 MG tablet  Commonly known as:  LASIX     metFORMIN 500 MG tablet  Commonly known as:  GLUCOPHAGE     simvastatin 10 MG tablet  Commonly known as:  ZOCOR     temazepam 15 MG capsule  Commonly known as:  RESTORIL      TAKE these medications        acyclovir 800 MG tablet  Commonly known as:  ZOVIRAX  Take 1 tablet (800 mg total) by mouth 5 (five) times daily. For 7 days     aspirin 325 MG tablet  Take 1 tablet (325 mg total) by mouth daily.     atorvastatin 20 MG tablet  Commonly known as:  LIPITOR  Take 1 tablet (20 mg total) by mouth daily at 6 PM.     clonazePAM 0.5 MG tablet  Commonly known as:  KLONOPIN  Take 4 tablets (2 mg total) by mouth at bedtime.     donepezil 5 MG tablet  Commonly known as:  ARICEPT  Take 1 tablet (5 mg total) by mouth at bedtime.     feeding supplement (GLUCERNA SHAKE) Liqd  Take 237 mLs by mouth 3 (three) times daily between meals.     insulin aspart 100 UNIT/ML injection  Commonly known as:  NOVOLOG  Before each meal 3 times a day, 140-199 - 2 units, 200-250 - 4 units, 251-299 - 6 units,  300-349 - 8 units,  350 or above 10 units. Dispense syringes and needles as needed, Ok to switch to PEN if approved. Substitute to any brand approved. DX DM2, Code E11.65     lisinopril 10 MG tablet  Commonly known as:  PRINIVIL,ZESTRIL  Take 10 mg by mouth daily.     neomycin-bacitracin-polymyxin ointment  Commonly known as:  NEOSPORIN  Apply topically 2 (two)  times daily. Applied to right groin for 7 days     tamsulosin 0.4 MG  Caps capsule  Commonly known as:  FLOMAX  Take 1 capsule (0.4 mg total) by mouth daily.        Major procedures and Radiology Reports - PLEASE review detailed and final reports for all details, in brief -    TTE - Left ventricle: Wall thickness was increased in a pattern of mildLVH. Systolic function was vigorous. The estimated ejectionfraction was in the range of 65% to 70%. Wall motion was normal;there were no regional wall motion abnormalities. Dopplerparameters are consistent with abnormal left ventricular relaxation (grade 1 diastolic dysfunction). Aortic valve: Mildly calcified annulus.   Carotids - Preliminary report: Bilateral: 1-39% ICA stenosis. Vertebral artery flow is antegrade.   Bilateral lower extremity venous duplex. No blood clots.    Ct Head Wo Contrast  07/09/2015  CLINICAL DATA:  Right-sided weakness, altered mental status. Nonverbal. Alzheimer's. EXAM: CT HEAD WITHOUT CONTRAST TECHNIQUE: Contiguous axial images were obtained from the base of the skull through the vertex without intravenous contrast. COMPARISON:  None. FINDINGS: There is generalized brain atrophy with commensurate dilatation of the ventricles and sulci. Confluent areas of chronic small vessel ischemic change noted within the bilateral periventricular and subcortical white matter regions. Small old lacunar infarct within the right basal ganglia. There are chronic calcified atherosclerotic changes of the large vessels at the skull base. There is no mass, hemorrhage, edema, or other evidence of acute parenchymal abnormality. No extra-axial hemorrhage. No osseous abnormality. Visualized upper paranasal sinuses are clear. Mastoid air cells are clear. IMPRESSION: 1. No evidence of acute intracranial abnormality. No intracranial mass, hemorrhage, or edema. 2. Atrophy and chronic ischemic changes as detailed above. These results were called by telephone at the time of interpretation on 07/09/2015 at 9:27  pm to Dr. Azalia BilisKEVIN CAMPOS , who verbally acknowledged these results. Electronically Signed   By: Bary RichardStan  Maynard M.D.   On: 07/09/2015 21:27   Dg Chest Port 1 View  07/10/2015  CLINICAL DATA:  Aspiration pneumonia, confusion EXAM: PORTABLE CHEST 1 VIEW COMPARISON:  07/09/2015 FINDINGS: Low lung volumes. Calcified granuloma in the right upper lobe. No focal consolidation. No pleural effusion or pneumothorax. The heart is normal in size. IMPRESSION: No evidence of acute cardiopulmonary disease. Electronically Signed   By: Charline BillsSriyesh  Krishnan M.D.   On: 07/10/2015 09:44   Dg Chest Portable 1 View  07/09/2015  CLINICAL DATA:  Altered mental status for 2 weeks. Right-sided weakness. Initial encounter. EXAM: PORTABLE CHEST 1 VIEW COMPARISON:  None. FINDINGS: The lungs are hypoexpanded. A right-sided calcified granuloma is noted. Minimal left basilar atelectasis is noted. There is no evidence of pleural effusion or pneumothorax. The cardiomediastinal silhouette is borderline normal in size. No acute osseous abnormalities are seen. IMPRESSION: Lungs hypoexpanded.  Minimal left basilar atelectasis noted. Electronically Signed   By: Roanna RaiderJeffery  Chang M.D.   On: 07/09/2015 21:21   Mr Brain Ltd W/o Cm  07/09/2015  CLINICAL DATA:  RIGHT-sided weakness after plane ride from [place]. Altered mental status for 2 weeks, aggressive, unable to walk. History of advanced at Alzheimer's disease. EXAM: MRI HEAD WITHOUT CONTRAST TECHNIQUE: Axial diffusion weighted imaging, sagittal T1 and axial T2 propeller. Patient was unable to tolerate further imaging due to agitation. COMPARISON:  CT head July 09, 2015 at 2114 hours. FINDINGS: Severely motion degraded axial T2 sequence, moderately motion degraded sagittal T1. 12 x 15 mm area of reduced diffusion LEFT posterior frontal lobe, with  normalizing ADC values, and T2 bright signal. Moderate ventriculomegaly on the basis of global parenchymal brain volume loss better seen on prior CT.  Patchy to confluent supratentorial white matter T2 hyperintensities. No midline shift. No abnormal extra-axial fluid collections. Major intracranial vascular flow voids present at the skullbase. No abnormal sellar expansion. No cerebellar tonsillar ectopia. No suspicious calvarial bone marrow signal. IMPRESSION: Limited motion degraded MRI, patient unable to complete the examination due to agitation. 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 At least moderate global parenchymal brain volume loss and moderate chronic small vessel ischemic disease. Electronically Signed   By: Awilda Metro M.D.   On: 07/09/2015 23:38    Micro Results      Recent Results (from the past 240 hour(s))  Blood culture (routine x 2)     Status: None (Preliminary result)   Collection Time: 07/09/15  9:24 PM  Result Value Ref Range Status   Specimen Description BLOOD RIGHT ARM  Final   Special Requests IN PEDIATRIC BOTTLE 2CC  Final   Culture NO GROWTH 4 DAYS  Final   Report Status PENDING  Incomplete  Blood culture (routine x 2)     Status: None (Preliminary result)   Collection Time: 07/09/15  9:29 PM  Result Value Ref Range Status   Specimen Description BLOOD RIGHT FOREARM  Final   Special Requests IN PEDIATRIC BOTTLE 2CC  Final   Culture NO GROWTH 4 DAYS  Final   Report Status PENDING  Incomplete  Urine culture     Status: None   Collection Time: 07/09/15 10:10 PM  Result Value Ref Range Status   Specimen Description URINE, RANDOM  Final   Special Requests NONE  Final   Culture NO GROWTH 1 DAY  Final   Report Status 07/11/2015 FINAL  Final       Today   Subjective    Willie Anderson today in bed, appears to be in no discomfort, does not follow commands reliably   Objective   Blood pressure 136/80, pulse 90, temperature 98.4 F (36.9 C), temperature source Axillary, resp. rate 15, height 5\' 10"  (1.778 m), weight 81.647 kg (180 lb), SpO2 97 %.  No intake or output data  in the 24 hours ending 07/14/15 0932  Exam Awake , remains somewhat confused, does have some right-sided weakness, unable to follow commands reliably [place].AT,PERRAL Supple Neck,No JVD, No cervical lymphadenopathy appriciated.  Symmetrical Chest wall movement, Good air movement bilaterally, CTAB RRR,No Gallops,Rubs or new Murmurs, No Parasternal Heave +ve B.Sounds, Abd Soft, Non tender, No organomegaly appriciated, No rebound -guarding or rigidity. No Cyanosis, Clubbing or edema, No new Rash or bruise, right groin has a vesicular rash about 1 inch in diameter   Data Review   CBC w Diff:  Lab Results  Component Value Date   WBC 8.7 07/11/2015   HGB 13.4 07/11/2015   HCT 39.0 07/11/2015   PLT 218 07/11/2015   LYMPHOPCT 13 07/09/2015   MONOPCT 6 07/09/2015   EOSPCT 1 07/09/2015   BASOPCT 0 07/09/2015    CMP:  Lab Results  Component Value Date   NA 134* 07/10/2015   K 4.2 07/10/2015   CL 100* 07/10/2015   CO2 24 07/10/2015   BUN 13 07/10/2015   CREATININE 0.98 07/10/2015   PROT 6.6 07/10/2015   ALBUMIN 3.2* 07/10/2015   BILITOT 0.8 07/10/2015   ALKPHOS 82 07/10/2015   AST 25 07/10/2015   ALT 19 07/10/2015  . Lab Results  Component Value Date   HGBA1C 7.0* 07/10/2015   Lab Results  Component Value Date   CHOL 148 07/11/2015   HDL 26* 07/11/2015   LDLCALC 87 07/11/2015   TRIG 174* 07/11/2015   CHOLHDL 5.7 07/11/2015      Total Time in preparing paper work, data evaluation and todays exam - 35 minutes  Leroy Sea M.D on 07/14/2015 at 9:32 AM  [facility]   Office  [phone]

## 2015-07-13 NOTE — Progress Notes (Signed)
Speech Language Pathology Treatment: Dysphagia;Cognitive-Linquistic  Patient Details Name: Willie Anderson MRN: [id number] DOB: 03/04/1936 Today's Date: 07/13/2015 Time: [id number] SLP Time Calculation (min) (ACUTE ONLY): 11 min  Assessment / Plan / Recommendation Clinical Impression  Pt was seen for skilled ST primarily targeting cognitive goals.  Upon arrival, pt was seated upright in bed, lethargic, confused, not following commands.  SLP offered pt thin liquids via straw which pt consumed with total assist for self feeding and without overt s/s of aspiration; however, after limited amounts, pt reported via telephonic interpreter that he was "full" and he declined further trials.  In general, pt remained non responsive to therapist or interpreter.  He did not answer immediate biographical, environmental, or orientation questions despite max assist cues and increased time.  SLP reoriented pt to place and situation.  Pt was noted to require increased cues to keep eyes open as session progressed.    Palliative care consult noted in pt's chart.  Will continue to follow up and modify plan of care as appropriate once family decides on course of action.     HPI Other Pertinent Information: Willie Craftrinidad Mulroy is an 79 y.o. male with a past medical history significant for HTN, HLD, DM, and advanced dementia, brought in by family for evaluation of increased agitation and inability to move the right side. Patient has been living in [place] and reportedly has been increasingly agitated for the past few weeks, and at least 2 weeks ago became weak in the right side and stopped walking. Pt brought by daughter stating that they just got off a plane from [place]. MRI brain showed an acute infarct left frontal lobe.   Pertinent Vitals Pain Assessment: No/denies pain Faces Pain Scale: No hurt  SLP Plan  Continue with current plan of care    Recommendations Diet recommendations: Dysphagia 1 (puree);Thin  liquid Liquids provided via: Cup;Straw Medication Administration: Crushed with puree Supervision: Full supervision/cueing for compensatory strategies;Staff to assist with self feeding Compensations: Slow rate Postural Changes and/or Swallow Maneuvers: Seated upright 90 degrees              Oral Care Recommendations: Oral care BID Follow up Recommendations: 24 hour supervision/assistance Plan: Continue with current plan of care    GO     Berenice Oehlert, Melanee Spryicole L 07/13/2015, 3:09 PM

## 2015-07-13 NOTE — Progress Notes (Signed)
Physical Therapy Treatment Patient Details Name: Willie Anderson MRN: [id number] DOB: 1936/04/18 Today's Date: 07/13/2015    History of Present Illness 79 yo male transferred from [place] after stroke, flew with family.  Has LEFT posterior frontal lobe infarct.   Moderate small vessel ischemia and dementia layered wtih stroke and ambulatory at baseline.  PMHx:  DM, HTN, HLD.    PT Comments    Slow improvements.  Pt was participative with therapies.  Focus on general mobility today including rolling, transition to sit, scooting, sitting balance and standing.  Follow Up Recommendations  SNF     Equipment Recommendations  None recommended by PT (TBA next venue)    Recommendations for Other Services       Precautions / Restrictions Precautions Precautions: Fall Restrictions Weight Bearing Restrictions: No    Mobility  Bed Mobility Overal bed mobility: Needs Assistance Bed Mobility: Rolling;Sidelying to Sit;Sit to Supine Rolling: Mod assist Sidelying to sit: Max assist;+2 for physical assistance Supine to sit: Total assist;+2 for physical assistance     General bed mobility comments: guided pt in assisting with L UE, Needed significant truncal assist  Transfers Overall transfer level: Needs assistance   Transfers: Sit to/from Stand Sit to Stand: Total assist;+2 physical assistance;+2 safety/equipment;From elevated surface         General transfer comment: Only partial stand with 2 person assist from each side, but fully upright with face to face assist  Ambulation/Gait                 Stairs            Wheelchair Mobility    Modified Rankin (Stroke Patients Only) Modified Rankin (Stroke Patients Only) Pre-Morbid Rankin Score: No significant disability Modified Rankin: Severe disability     Balance Overall balance assessment: Needs assistance Sitting-balance support: Single extremity supported;Bilateral upper extremity supported;Feet  unsupported Sitting balance-Leahy Scale: Poor Sitting balance - Comments: heavy to moderate lean posteriorly.  pt held lean for 2-3 seconds before listing further   Standing balance support: Bilateral upper extremity supported Standing balance-Leahy Scale: Zero Standing balance comment: stood x2 with much better stance when assisted face to face.                    Cognition Arousal/Alertness: Awake/alert Behavior During Therapy: Flat affect;Restless Overall Cognitive Status: History of cognitive impairments - at baseline Area of Impairment: Following commands;Safety/judgement;Awareness;Problem solving Orientation Level: Person Current Attention Level: Sustained   Following Commands: Follows one step commands inconsistently Safety/Judgement: Decreased awareness of safety;Decreased awareness of deficits Awareness: Intellectual Problem Solving: Slow processing;Decreased initiation;Requires verbal cues;Requires tactile cues General Comments: Pt with h/o dementia at baseline per chart, but no clear information in chart about his functional level pta    Exercises      General Comments General comments (skin integrity, edema, etc.): Pt intially restles and pulled at clothing, swatted at assist, but calmed as he got to know [place].  Responded to basic spanish cues.      Pertinent Vitals/Pain Pain Assessment: Faces Faces Pain Scale: No hurt    Home Living                      Prior Function            PT Goals (current goals can now be found in the care plan section) Acute Rehab PT Goals Patient Stated Goal: unable PT Goal Formulation: Patient unable to participate in goal setting Time For Goal  Achievement: 07/25/15 Potential to Achieve Goals: Good Progress towards PT goals: Progressing toward goals    Frequency       PT Plan Current plan remains appropriate    Co-evaluation             End of Session   Activity Tolerance: Patient tolerated treatment  well;Patient limited by fatigue Patient left: in bed;with call bell/phone within reach;with bed alarm set     Time: 1355-1419 PT Time Calculation (min) (ACUTE ONLY): 24 min  Charges:  $Therapeutic Activity: 23-37 mins                    G Codes:      Cyrene Gharibian, Eliseo GumKenneth V 07/13/2015, 2:43 PM 07/13/2015  [place] BingKen Bridgitte Felicetti, PT [phone]  (pager)

## 2015-07-13 NOTE — Discharge Instructions (Signed)
Follow with Primary MD in 7 days  ° °Get CBC, CMP, 2 view Chest X ray checked  by Primary MD next visit.  ° ° °Activity: As tolerated with Full fall precautions use walker/cane & assistance as needed ° ° °Disposition SNF ° ° °Diet: Dysphagia 1 with feeding assistance and aspiration precautions. ° °For Heart failure patients - Check your Weight same time everyday, if you gain over 2 pounds, or you develop in leg swelling, experience more shortness of breath or chest pain, call your Primary MD immediately. Follow Cardiac Low Salt Diet and 1.5 lit/day fluid restriction. ° ° °On your next visit with your primary care physician please Get Medicines reviewed and adjusted. ° ° °Please request your Prim.MD to go over all Hospital Tests and Procedure/Radiological results at the follow up, please get all Hospital records sent to your Prim MD by signing hospital release before you go home. ° ° °If you experience worsening of your admission symptoms, develop shortness of breath, life threatening emergency, suicidal or homicidal thoughts you must seek medical attention immediately by calling 911 or calling your MD immediately  if symptoms less severe. ° °You Must read complete instructions/literature along with all the possible adverse reactions/side effects for all the Medicines you take and that have been prescribed to you. Take any new Medicines after you have completely understood and accpet all the possible adverse reactions/side effects.  ° °Do not drive, operating heavy machinery, perform activities at heights, swimming or participation in water activities or provide baby sitting services if your were admitted for syncope or siezures until you have seen by Primary MD or a Neurologist and advised to do so again. ° °Do not drive when taking Pain medications.  ° ° °Do not take more than prescribed Pain, Sleep and Anxiety Medications ° °Special Instructions: If you have smoked or chewed Tobacco  in the last 2 yrs please stop  smoking, stop any regular Alcohol  and or any Recreational drug use. ° °Wear Seat belts while driving. ° ° °Please note ° °You were cared for by a hospitalist during your hospital stay. If you have any questions about your discharge medications or the care you received while you were in the hospital after you are discharged, you can call the unit and asked to speak with the hospitalist on call if the hospitalist that took care of you is not available. Once you are discharged, your primary care physician will handle any further medical issues. Please note that NO REFILLS for any discharge medications will be authorized once you are discharged, as it is imperative that you return to your primary care physician (or establish a relationship with a primary care physician if you do not have one) for your aftercare needs so that they can reassess your need for medications and monitor your lab values. ° °

## 2015-07-14 LAB — CULTURE, BLOOD (ROUTINE X 2)
CULTURE: NO GROWTH
CULTURE: NO GROWTH

## 2015-07-14 LAB — GLUCOSE, CAPILLARY
GLUCOSE-CAPILLARY: 174 mg/dL — AB (ref 65–99)
Glucose-Capillary: 123 mg/dL — ABNORMAL HIGH (ref 65–99)

## 2015-07-14 MED ORDER — ACYCLOVIR 800 MG PO TABS: 800.0000 mg | ORAL_TABLET | Freq: Every day | ORAL | Status: AC

## 2015-07-14 MED ORDER — BACITRACIN-NEOMYCIN-POLYMYXIN 400-5-5000 EX OINT: TOPICAL_OINTMENT | Freq: Two times a day (BID) | CUTANEOUS | Status: AC

## 2015-07-14 MED ORDER — ACYCLOVIR 800 MG PO TABS
800.0000 mg | ORAL_TABLET | Freq: Every day | ORAL | Status: DC
  Administered 2015-07-14: 800 mg via ORAL
  Filled 2015-07-14: qty 1

## 2015-07-14 MED ORDER — BACITRACIN-NEOMYCIN-POLYMYXIN OINTMENT TUBE
TOPICAL_OINTMENT | Freq: Two times a day (BID) | CUTANEOUS | Status: DC
  Administered 2015-07-14: 11:00:00 via TOPICAL
  Filled 2015-07-14 (×2): qty 1

## 2015-07-14 NOTE — Clinical Social Work Placement (Signed)
   CLINICAL SOCIAL WORK PLACEMENT  NOTE  Date:  07/14/2015  Patient Details  Name: Willie Anderson MRN: [id number] Date of Birth: 08/20/1936  Clinical Social Work is seeking post-discharge placement for this patient at the Skilled  Nursing Facility level of care (*CSW will initial, date and re-position this form in  chart as items are completed):  Yes   Patient/family provided with [facility] Clinical Social Work Department's list of facilities offering this level of care within the geographic area requested by the patient (or if unable, by the patient's family).  Yes   Patient/family informed of their freedom to choose among providers that offer the needed level of care, that participate in Medicare, Medicaid or managed care program needed by the patient, have an available bed and are willing to accept the patient.  Yes   Patient/family informed of [facility]'s ownership interest in [facility] and [facility], as well as of the fact that they are under no obligation to receive care at these facilities.  PASRR submitted to [employer] on 07/12/15     PASRR number received on 07/12/15     Existing PASRR number confirmed on       FL2 transmitted to all facilities in geographic area requested by pt/family on 07/12/15     FL2 transmitted to all facilities within larger geographic area on       Patient informed that his/her managed care company has contracts with or will negotiate with certain facilities, including the following:        Yes   Patient/family informed of bed offers received.  [name]     Physician recommends and patient chooses bed at      [name].  Patient to be transferred to facility by [facility]     Patient family notified on 07/14/15 of transfer.  Name of family member notified:  [facility]     PHYSICIAN       Additional Comment:     _______________________________________________ Rod MaeVaughn, Anne Sebring S, LCSW 07/14/2015, 4:02 PM

## 2015-07-14 NOTE — Progress Notes (Signed)
Report given to Nursing supervisor Sherron Mondaylair [facility]. All belongings sent with patient. Daughter is aware of disposition. Awaiting for transportation.   Sim BoastHavy, RN

## 2015-07-14 NOTE — Discharge Planning (Signed)
[facility]. Patient's daughter, Myriam JacobsonHelen, updated regarding discharge.  [facility] RN report number: [phone]) [phone] Transportation: EMS  Marcelline Deistmily Amandine [place]ino, [place] - [phone] Clinical Social Work Department Orthopedics [phone]([id number]) and Surgical [phone]([id number])

## 2015-07-14 NOTE — Progress Notes (Signed)
Interpreter attempted to provide service with RN but pt is confused at this time and Interpreter unable to understand. Will singed off. Awaiting for daughter to come.    Sim BoastHavy, RN

## 2015-07-15 LAB — GLUCOSE, CAPILLARY: Glucose-Capillary: 165 mg/dL — ABNORMAL HIGH (ref 65–99)

## 2015-09-15 ENCOUNTER — Ambulatory Visit: Payer: Medicare (Managed Care) | Admitting: Neurology

## 2017-07-15 IMAGING — CT CT HEAD W/O CM
2 series · 14 of 30 positions shown, 16 images · non-contrast
Comparison: None.

CLINICAL DATA: Right-sided weakness, altered mental status.
Nonverbal. Alzheimer's.

EXAM:
CT HEAD WITHOUT CONTRAST
TECHNIQUE: Contiguous axial images were obtained from the base of the skull
through the vertex without intravenous contrast.

[Series 2: head without · axial · non-contrast · 0.44mm/px · z∈[-122,-22]mm · 6 of 30 slices shown, 8 images]
[im 5/30  brain]
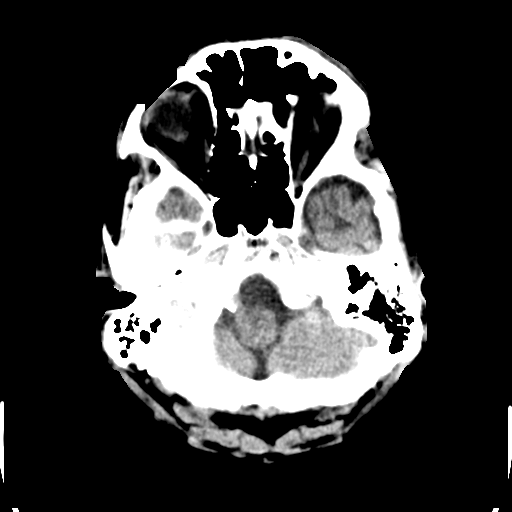
[im 5/30  bone]
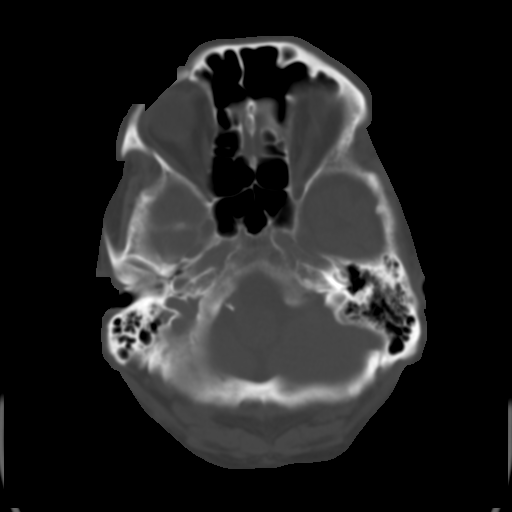
[im 9/30  brain]
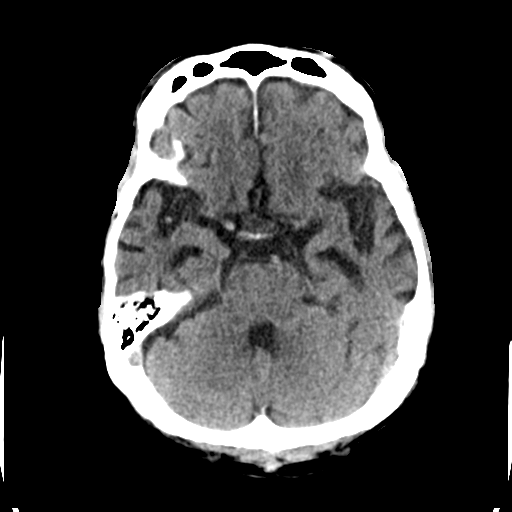
[im 13/30  brain]
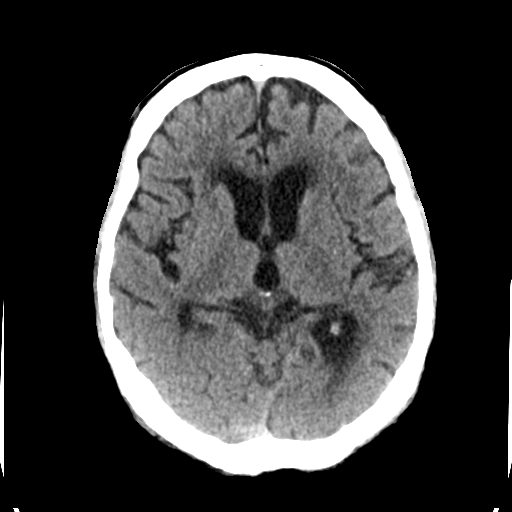
[im 17/30  brain]
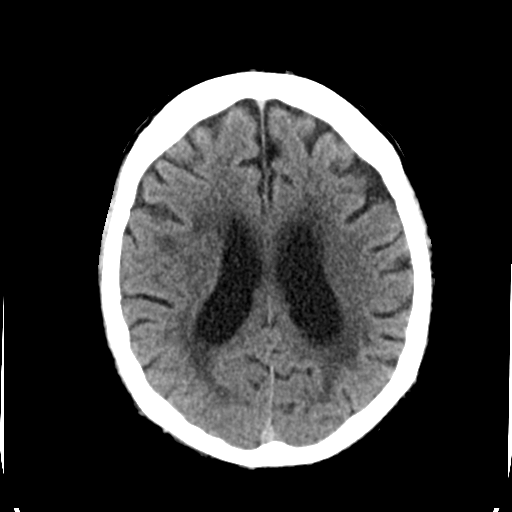
[im 21/30  brain]
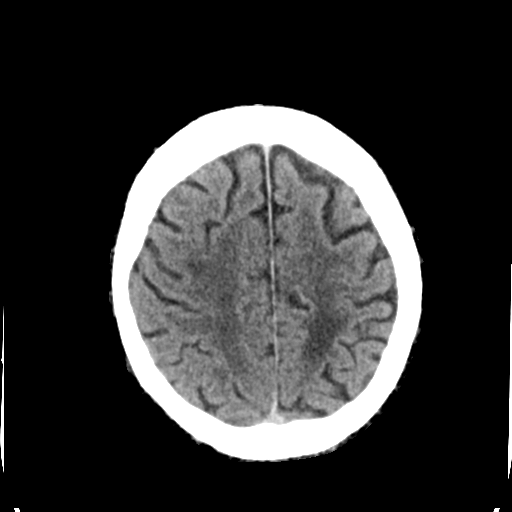
[im 21/30  bone]
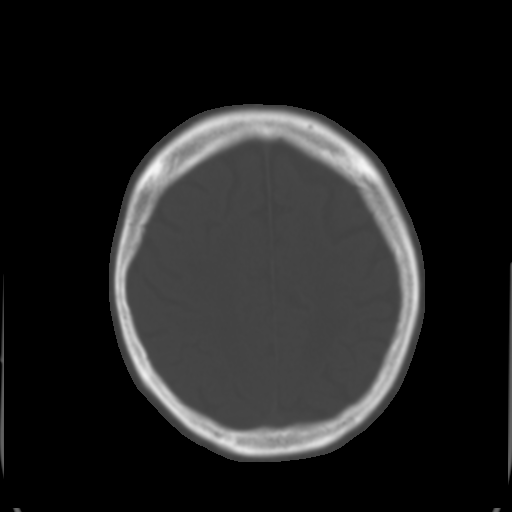
[im 25/30  brain]
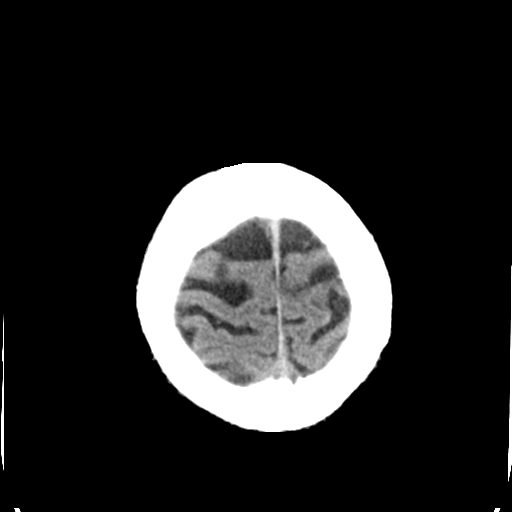

[Series 3: head bone · axial · 0.44mm/px · z∈[-128,-6]mm · 8 of 77 slices shown]
[im 8/77  bone]
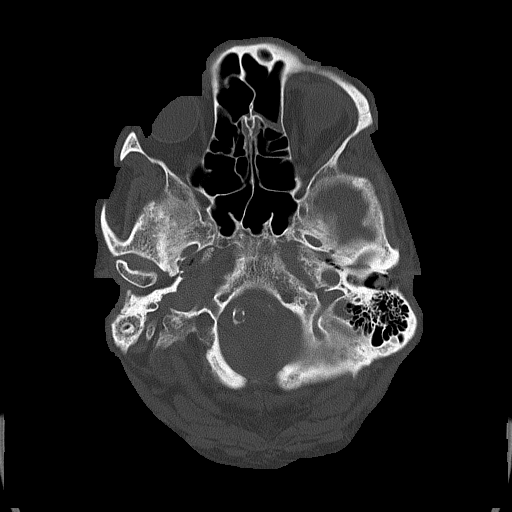
[im 15/77  bone]
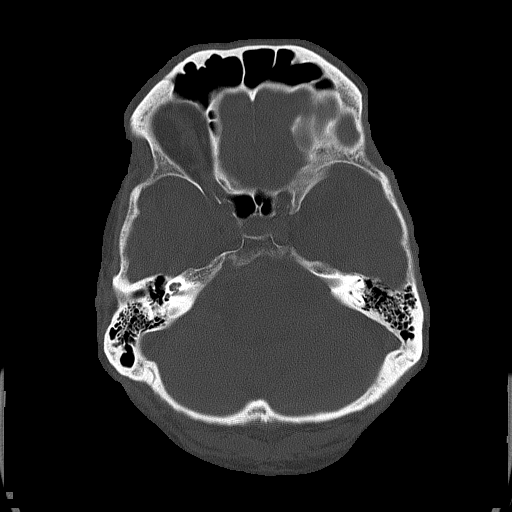
[im 26/77  bone]
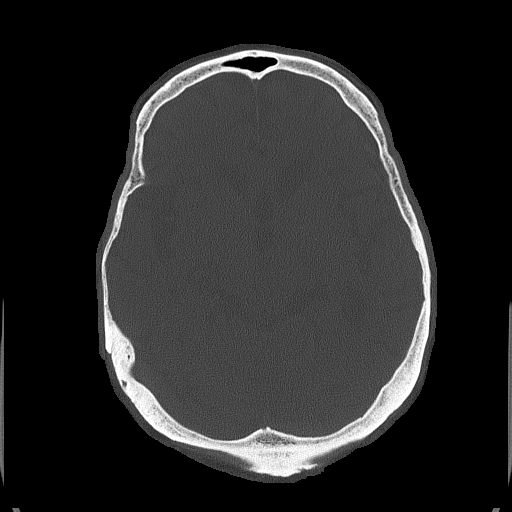
[im 33/77  bone]
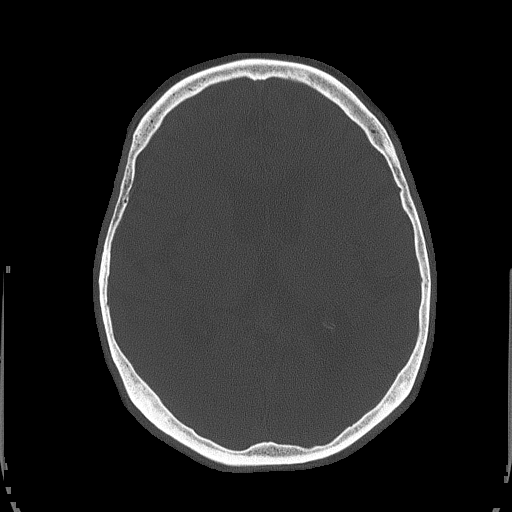
[im 44/77  bone]
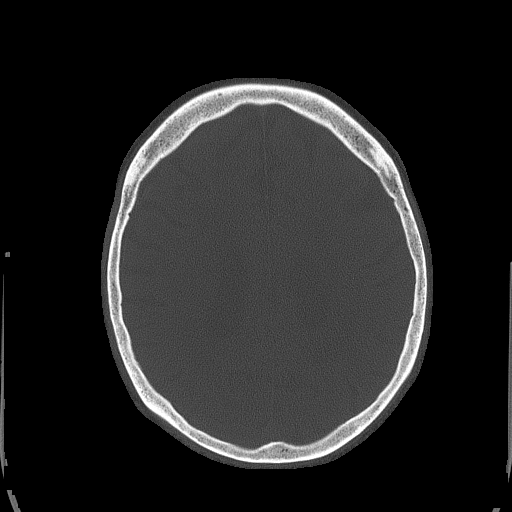
[im 51/77  bone]
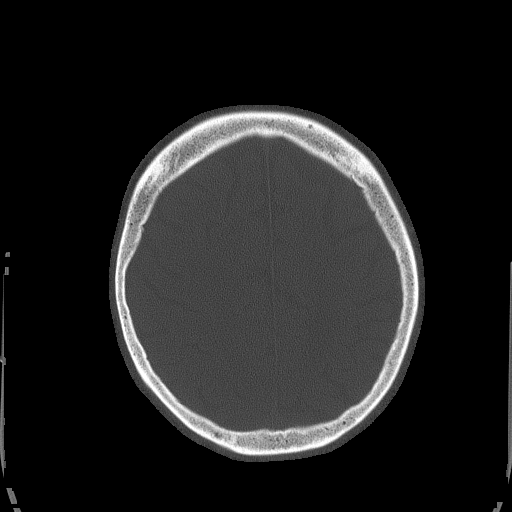
[im 62/77  bone]
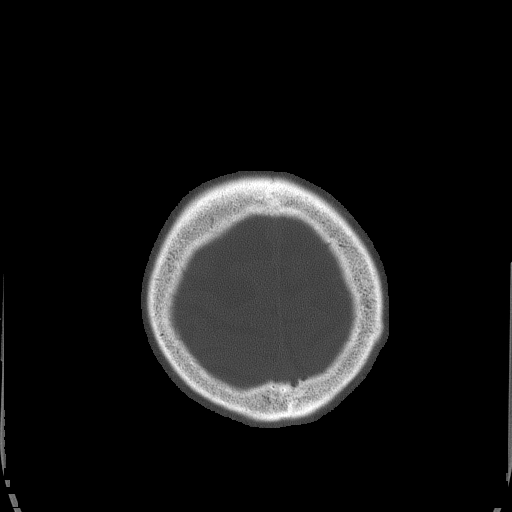
[im 69/77  bone]
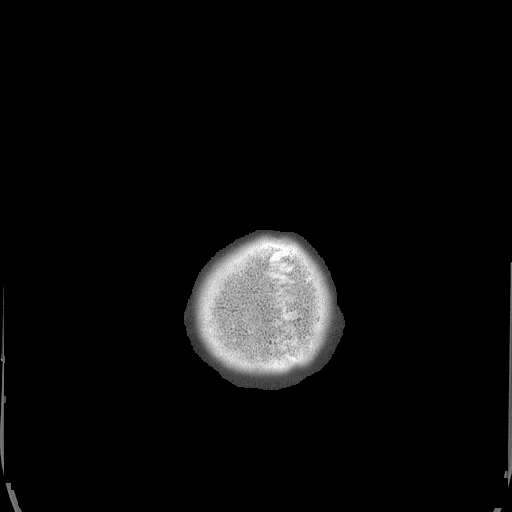

[14 of 30 positions shown; findings below may reference images not displayed]

FINDINGS: There is generalized brain atrophy with commensurate dilatation of
the ventricles and sulci. Confluent areas of chronic small vessel
ischemic change noted within the bilateral periventricular and
subcortical white matter regions. Small old lacunar infarct within
the right basal ganglia. There are chronic calcified atherosclerotic
changes of the large vessels at the skull base.

There is no mass, hemorrhage, edema, or other evidence of acute
parenchymal abnormality. No extra-axial hemorrhage. No osseous
abnormality. Visualized upper paranasal sinuses are clear. Mastoid
air cells are clear.
IMPRESSION: 1. No evidence of acute intracranial abnormality. No intracranial
mass, hemorrhage, or edema.
2. Atrophy and chronic ischemic changes as detailed above.
These results were called by telephone at the time of interpretation
on 07/09/2015 at [DATE] to Dr. ANNA-MI PACULE , who verbally
acknowledged these results.

## 2017-07-16 IMAGING — CR DG CHEST 1V PORT
1 series · 1 of 1 positions shown · non-contrast
Comparison: 07/09/2015

CLINICAL DATA: Aspiration pneumonia, confusion

EXAM:
PORTABLE CHEST 1 VIEW

[AP]
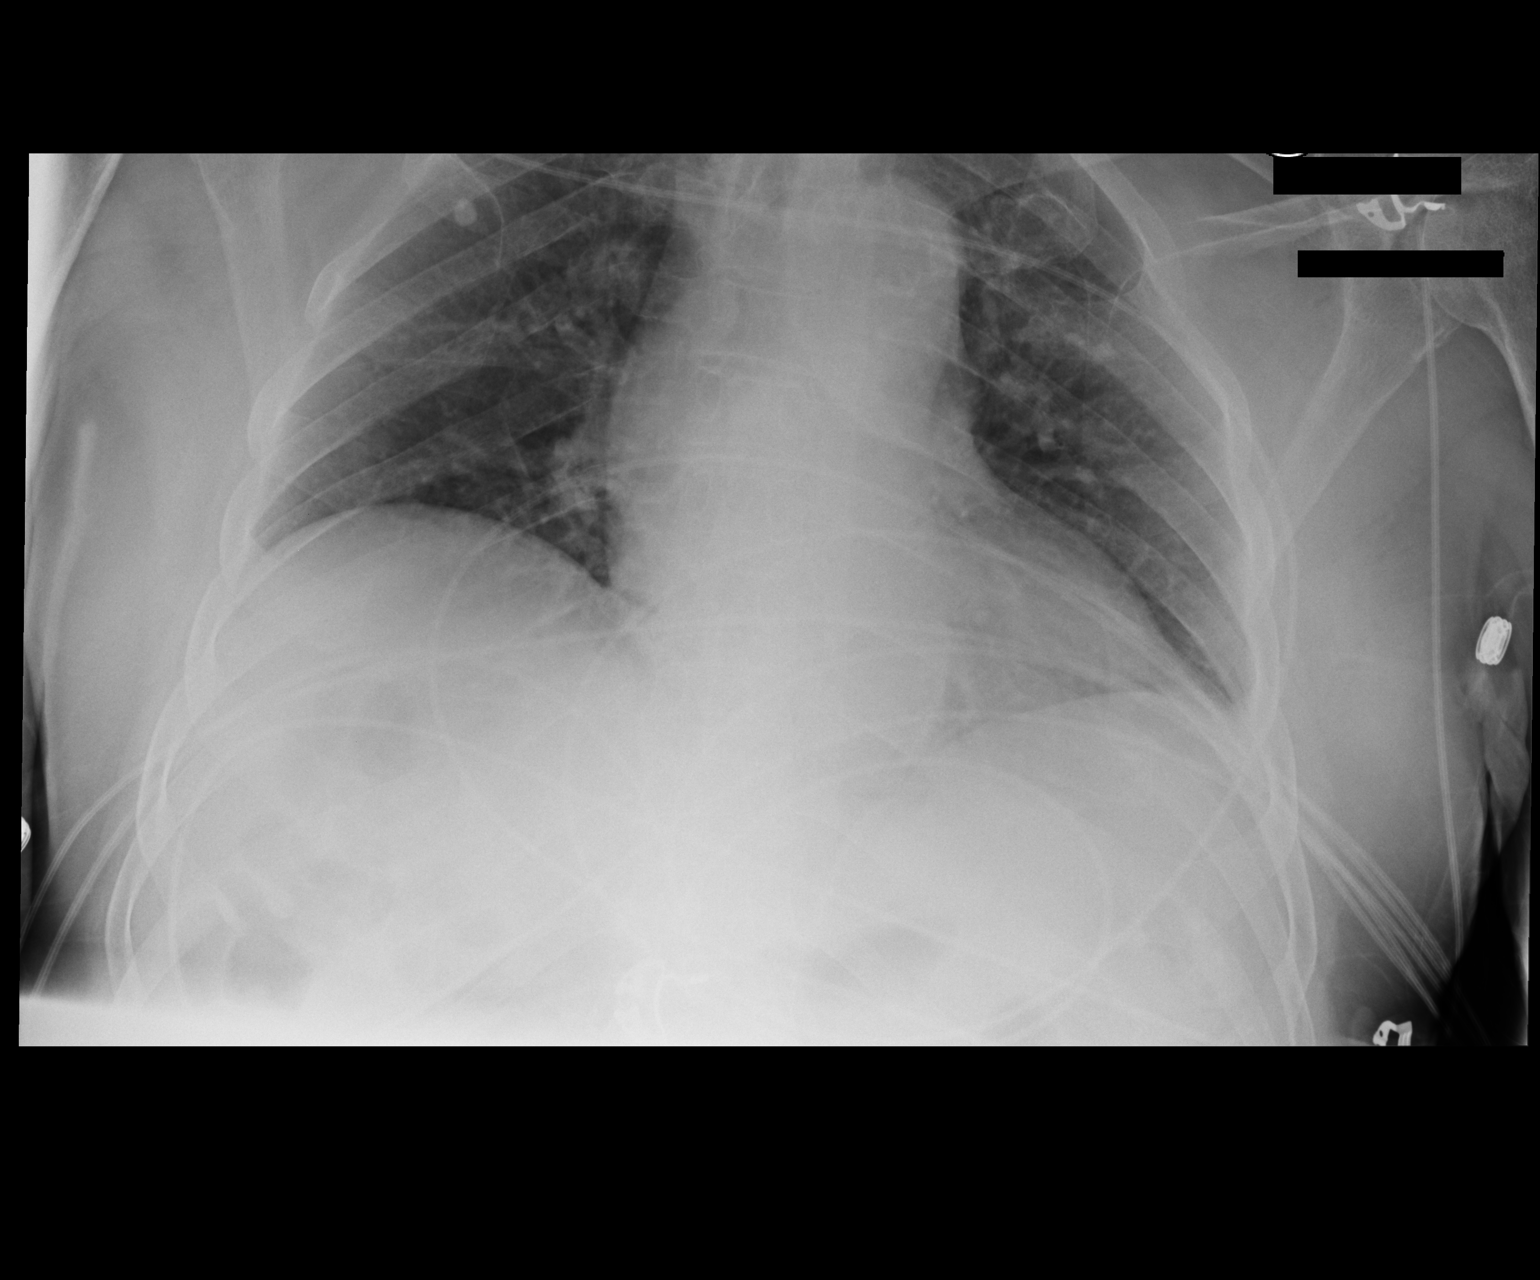

[1 of 1 positions shown; findings below may reference images not displayed]

FINDINGS: Low lung volumes.

Calcified granuloma in the right upper lobe. No focal consolidation.
No pleural effusion or pneumothorax.

The heart is normal in size.
IMPRESSION: No evidence of acute cardiopulmonary disease.
